# Patient Record
Sex: Female | Born: 1970 | Race: Black or African American | Hispanic: No | Marital: Married | Smoking: Never smoker
Health system: Southern US, Community
[De-identification: ages and names within clinical notes are randomized; demographics above are authoritative.]

---

## 2005-06-16 DIAGNOSIS — H6123 Impacted cerumen, bilateral: Secondary | ICD-10-CM | POA: Insufficient documentation

## 2012-02-20 DIAGNOSIS — O2690 Pregnancy related conditions, unspecified, unspecified trimester: Secondary | ICD-10-CM | POA: Insufficient documentation

## 2012-02-20 DIAGNOSIS — O009 Unspecified ectopic pregnancy without intrauterine pregnancy: Secondary | ICD-10-CM | POA: Insufficient documentation

## 2013-06-14 DIAGNOSIS — M7740 Metatarsalgia, unspecified foot: Secondary | ICD-10-CM | POA: Insufficient documentation

## 2014-10-25 DIAGNOSIS — J019 Acute sinusitis, unspecified: Secondary | ICD-10-CM | POA: Insufficient documentation

## 2015-11-17 ENCOUNTER — Encounter: Payer: Self-pay | Admitting: Emergency Medicine

## 2015-11-17 ENCOUNTER — Emergency Department
Admission: EM | Admit: 2015-11-17 | Discharge: 2015-11-18 | Disposition: A | Discharge status: Home / Self Care | Payer: Managed Care, Other (non HMO) | Attending: Emergency Medicine | Admitting: Emergency Medicine

## 2015-11-17 DIAGNOSIS — N83209 Unspecified ovarian cyst, unspecified side: Secondary | ICD-10-CM

## 2015-11-17 DIAGNOSIS — N83202 Unspecified ovarian cyst, left side: Secondary | ICD-10-CM | POA: Insufficient documentation

## 2015-11-17 DIAGNOSIS — Z3202 Encounter for pregnancy test, result negative: Secondary | ICD-10-CM | POA: Insufficient documentation

## 2015-11-17 DIAGNOSIS — R109 Unspecified abdominal pain: Secondary | ICD-10-CM | POA: Diagnosis present

## 2015-11-17 LAB — CBC WITH DIFFERENTIAL/PLATELET
BASOS ABS: 0.1 10*3/uL (ref 0–0.1)
BASOS PCT: 1 %
EOS ABS: 0.2 10*3/uL (ref 0–0.7)
EOS PCT: 3 %
HCT: 32.3 % — ABNORMAL LOW (ref 35.0–47.0)
Hemoglobin: 10 g/dL — ABNORMAL LOW (ref 12.0–16.0)
LYMPHS PCT: 24 %
Lymphs Abs: 1.7 10*3/uL (ref 1.0–3.6)
MCH: 22.1 pg — ABNORMAL LOW (ref 26.0–34.0)
MCHC: 30.8 g/dL — ABNORMAL LOW (ref 32.0–36.0)
MCV: 71.5 fL — AB (ref 80.0–100.0)
Monocytes Absolute: 0.7 10*3/uL (ref 0.2–0.9)
Monocytes Relative: 10 %
Neutro Abs: 4.4 10*3/uL (ref 1.4–6.5)
Neutrophils Relative %: 62 %
PLATELETS: 250 10*3/uL (ref 150–440)
RBC: 4.52 MIL/uL (ref 3.80–5.20)
RDW: 14.5 % (ref 11.5–14.5)
WBC: 7 10*3/uL (ref 3.6–11.0)

## 2015-11-17 MED ORDER — SODIUM CHLORIDE 0.9 % IV BOLUS (SEPSIS)
1000.0000 mL | Freq: Once | INTRAVENOUS | Status: AC
  Administered 2015-11-17: 1000 mL via INTRAVENOUS

## 2015-11-17 MED ORDER — ALUM & MAG HYDROXIDE-SIMETH 200-200-20 MG/5ML PO SUSP
30.0000 mL | Freq: Once | ORAL | Status: AC
  Administered 2015-11-18: 30 mL via ORAL
  Filled 2015-11-17: qty 30

## 2015-11-17 MED ORDER — ACETAMINOPHEN 500 MG PO TABS
1000.0000 mg | ORAL_TABLET | ORAL | Status: AC
  Administered 2015-11-18: 1000 mg via ORAL
  Filled 2015-11-17: qty 2

## 2015-11-17 MED ORDER — ONDANSETRON 4 MG PO TBDP
4.0000 mg | ORAL_TABLET | Freq: Once | ORAL | Status: AC
  Administered 2015-11-18: 4 mg via ORAL
  Filled 2015-11-17: qty 1

## 2015-11-17 NOTE — ED Notes (Signed)
abd pain started a little after 3 pm yesterday.  Thought it was gas at first.  Relieved with passing gas and bowel movement.  Actually went away during the night and progressively went back to the level it was yesterday as the day went on.  Also ate a burger from [employer] and there was a metal wire like a bristle from a scrub used to clean grill in it, she spat that out.

## 2015-11-17 NOTE — ED Notes (Signed)
Pt c/o of upper abdominal pain currently rated at a 7 out of 10 described as sharp/stabbing/throbbing beginning yesterday.  Pt had bowel movement at approx 10:30am, and had a decrease in pain down to a 2 out of 10. Pt denies SOB, chest pain, N/V.  Pt still currently passing flatus.

## 2015-11-18 ENCOUNTER — Emergency Department: Payer: Managed Care, Other (non HMO)

## 2015-11-18 DIAGNOSIS — N83202 Unspecified ovarian cyst, left side: Secondary | ICD-10-CM | POA: Diagnosis not present

## 2015-11-18 LAB — URINALYSIS COMPLETE WITH MICROSCOPIC (ARMC ONLY)
BACTERIA UA: NONE SEEN
Bilirubin Urine: NEGATIVE
Glucose, UA: NEGATIVE mg/dL
HGB URINE DIPSTICK: NEGATIVE
Ketones, ur: NEGATIVE mg/dL
LEUKOCYTES UA: NEGATIVE
Nitrite: NEGATIVE
PH: 8 (ref 5.0–8.0)
PROTEIN: NEGATIVE mg/dL
Specific Gravity, Urine: 1.017 (ref 1.005–1.030)

## 2015-11-18 LAB — COMPREHENSIVE METABOLIC PANEL
ALBUMIN: 3.9 g/dL (ref 3.5–5.0)
ALT: 24 U/L (ref 14–54)
ANION GAP: 4 — AB (ref 5–15)
AST: 22 U/L (ref 15–41)
Alkaline Phosphatase: 66 U/L (ref 38–126)
BUN: 13 mg/dL (ref 6–20)
CO2: 26 mmol/L (ref 22–32)
Calcium: 8.8 mg/dL — ABNORMAL LOW (ref 8.9–10.3)
Chloride: 107 mmol/L (ref 101–111)
Creatinine, Ser: 0.88 mg/dL (ref 0.44–1.00)
GFR calc Af Amer: >60 mL/min (ref >60)
GFR calc non Af Amer: >60 mL/min (ref >60)
GLUCOSE: 116 mg/dL — AB (ref 65–99)
POTASSIUM: 3.8 mmol/L (ref 3.5–5.1)
SODIUM: 137 mmol/L (ref 135–145)
Total Bilirubin: 0.3 mg/dL (ref 0.3–1.2)
Total Protein: 7 g/dL (ref 6.5–8.1)

## 2015-11-18 LAB — HEMOGLOBIN AND HEMATOCRIT, BLOOD
HCT: 31.4 % — ABNORMAL LOW (ref 35.0–47.0)
HEMOGLOBIN: 9.7 g/dL — AB (ref 12.0–16.0)

## 2015-11-18 LAB — LIPASE, BLOOD: LIPASE: 32 U/L (ref 11–51)

## 2015-11-18 LAB — PREGNANCY, URINE: PREG TEST UR: NEGATIVE

## 2015-11-18 MED ORDER — IOHEXOL 240 MG/ML SOLN
25.0000 mL | Freq: Once | INTRAMUSCULAR | Status: AC | PRN
  Administered 2015-11-18: 25 mL via ORAL

## 2015-11-18 MED ORDER — ONDANSETRON 4 MG PO TBDP: 4.0000 mg | ORAL_TABLET | Freq: Four times a day (QID) | ORAL | Status: AC | PRN

## 2015-11-18 MED ORDER — HYDROCODONE-ACETAMINOPHEN 5-325 MG PO TABS: 1.0000 | ORAL_TABLET | Freq: Four times a day (QID) | ORAL | Status: DC | PRN

## 2015-11-18 MED ORDER — IOHEXOL 300 MG/ML  SOLN
100.0000 mL | Freq: Once | INTRAMUSCULAR | Status: AC | PRN
  Administered 2015-11-18: 100 mL via INTRAVENOUS

## 2015-11-18 NOTE — ED Notes (Signed)
Reviewed d/c instructions, prescriptions, pain management, and reasons to return for further care/treatment to MD and/or ED. Pt verbalized understanding.

## 2015-11-18 NOTE — ED Notes (Signed)
Pt. POCT RESULTS= NEGATIVE

## 2015-11-18 NOTE — ED Provider Notes (Signed)
[facility] Emergency Department Provider Note REMINDER - THIS NOTE IS NOT A FINAL MEDICAL RECORD UNTIL IT IS SIGNED. UNTIL THEN, THE CONTENT BELOW MAY REFLECT INFORMATION FROM A DOCUMENTATION TEMPLATE, NOT THE ACTUAL PATIENT VISIT. ____________________________________________  Time seen: Approximately 11:56 AM  I have reviewed the triage vital signs and the nursing notes.   HISTORY  Chief Complaint Abdominal Pain    HPI Tammy James is a 45 y.o. female presents for evaluation of 2 fairly severe episodes of abdominal pain. She reports that starting yesterday after eating [employer], she developed fairly severe crampy gas pains in the abdomen. Located across the mid abdomen. No associated any fevers chills chest pain or trouble breathing. He reports that this lasted for a few hours and "doubled her over" and pain. This went away, except for a slight feeling of discomfort after having a bowel movement. She also reports that she ate a burger at [employer] and there was a wire and it but she spit this out. No injury.  This evening she had another episode of fairly severe abdominal pain up into her over located the mid abdomen. It is now improved, but she still has a mild ache gassy feeling. Denies any pelvic pain, vaginal bleeding, pregnancy, or other new concerns. No chest pain or trouble breathing. No vomiting.  History reviewed. No pertinent past medical history.  There are no active problems to display for this patient.   History reviewed. No pertinent past surgical history.  Current Outpatient Rx  Name  Route  Sig  Dispense  Refill  . HYDROcodone-acetaminophen (NORCO/VICODIN) 5-325 MG tablet   Oral   Take 1 tablet by mouth every 6 (six) hours as needed for moderate pain.   20 tablet   0   . ondansetron (ZOFRAN ODT) 4 MG disintegrating tablet   Oral   Take 1 tablet (4 mg total) by mouth every 6 (six) hours as needed for nausea or vomiting.   20 tablet    0     Allergies Sulfa antibiotics and Alcohol-sulfur  No family history on file.  Social History Social History  Substance Use Topics  . Smoking status: Never Smoker   . Smokeless tobacco: None  . Alcohol Use: No    Review of Systems Constitutional: No fever/chills Eyes: No visual changes. ENT: No sore throat. Cardiovascular: Denies chest pain. Respiratory: Denies shortness of breath. Gastrointestinal: No nausea, no vomiting.  No diarrhea.  No constipation. Genitourinary: Negative for dysuria. Musculoskeletal: Negative for back pain. Skin: Negative for rash. Neurological: Negative for headaches, focal weakness or numbness.  10-point ROS otherwise negative.  ____________________________________________   PHYSICAL EXAM:  VITAL SIGNS: ED Triage Vitals  Enc Vitals Group     BP 11/17/15 2139 130/92 mmHg     Pulse Rate 11/17/15 2139 107     Resp 11/17/15 2139 18     Temp 11/17/15 2139 98.1 F (36.7 C)     Temp Source 11/17/15 2139 Oral     SpO2 11/17/15 2139 98 %     Weight 11/17/15 2139 191 lb (86.637 kg)     Height 11/17/15 2139 5\' 3"  (1.6 m)     Head Cir --      Peak Flow --      Pain Score 11/17/15 2141 7     Pain Loc --      Pain Edu? --      Excl. in GC? --    Constitutional: Alert and oriented. Well appearing  and in no acute distress. Amicable. Patient and her husband seated comfortably in room. Eyes: Conjunctivae are normal. PERRL. EOMI. Head: Atraumatic. Nose: No congestion/rhinnorhea. Mouth/Throat: Mucous membranes are moist.  Oropharynx non-erythematous. Neck: No stridor.   Cardiovascular: Normal rate, regular rhythm. Grossly normal heart sounds.  Good peripheral circulation. Respiratory: Normal respiratory effort.  No retractions. Lungs CTAB. Gastrointestinal: Soft and nondistended. Patient has moderate tenderness without rebound or guarding. The likely, also some moderate tenderness over McBurney's point and right flank. No distention. No  abdominal bruits. No CVA tenderness. No left-sided tenderness except for some mild suprapubic tenderness and also moderate discomfort over the left lower abdomen without rebound or guarding. Musculoskeletal: No lower extremity tenderness nor edema.  No joint effusions. Neurologic:  Normal speech and language. No gross focal neurologic deficits are appreciated. Skin:  Skin is warm, dry and intact. No rash noted. Psychiatric: Mood and affect are normal. Speech and behavior are normal.  ____________________________________________   LABS (all labs ordered are listed, but only abnormal results are displayed)  Labs Reviewed  COMPREHENSIVE METABOLIC PANEL - Abnormal; Notable for the following:    Glucose, Bld 116 (*)    Calcium 8.8 (*)    Anion gap 4 (*)    All other components within normal limits  CBC WITH DIFFERENTIAL/PLATELET - Abnormal; Notable for the following:    Hemoglobin 10.0 (*)    HCT 32.3 (*)    MCV 71.5 (*)    MCH 22.1 (*)    MCHC 30.8 (*)    All other components within normal limits  URINALYSIS COMPLETEWITH MICROSCOPIC ([facility] ONLY) - Abnormal; Notable for the following:    Color, Urine YELLOW (*)    APPearance CLEAR (*)    Squamous Epithelial / LPF 0-5 (*)    All other components within normal limits  HEMOGLOBIN AND HEMATOCRIT, BLOOD - Abnormal; Notable for the following:    Hemoglobin 9.7 (*)    HCT 31.4 (*)    All other components within normal limits  LIPASE, BLOOD  PREGNANCY, URINE  POC URINE PREG, ED   ____________________________________________  EKG   ____________________________________________  RADIOLOGY  US Transvaginal Non-OB (Final result) Result time: 11/18/15 04:59:47   Final result by Rad Results In Interface (11/18/15 04:59:47)   Narrative:   CLINICAL DATA: Acute onset of periumbilical abdominal pain. Left adnexal cyst noted on CT. Evaluate for ovarian torsion. Initial encounter.  EXAM: TRANSABDOMINAL AND TRANSVAGINAL ULTRASOUND OF  PELVIS  DOPPLER ULTRASOUND OF OVARIES  TECHNIQUE: Both transabdominal and transvaginal ultrasound examinations of the pelvis were performed. Transabdominal technique was performed for global imaging of the pelvis including uterus, ovaries, adnexal regions, and pelvic cul-de-sac.  It was necessary to proceed with endovaginal exam following the transabdominal exam to visualize the uterus and ovaries in greater detail. Color and duplex Doppler ultrasound was utilized to evaluate blood flow to the ovaries.  COMPARISON: CT of the abdomen and pelvis performed earlier today at 1:57 a.m.  FINDINGS: Uterus  Measurements: 11.6 x 4.8 x 6.3 cm. No fibroids or other mass visualized.  Endometrium  Thickness: 1.4 cm. No focal abnormality visualized.  Right ovary  Measurements: 3.8 x 1.8 x 1.5 cm. Normal appearance/no adnexal mass.  Left ovary  Measurements: 4.6 x 2.9 x 3.0 cm. There is a mildly complex left ovarian cyst measuring 3.2 x 2.3 x 2.2 cm, with internal septation and scattered echoes, likely reflecting a hemorrhagic cyst.  Pulsed Doppler evaluation of both ovaries demonstrates normal low-resistance arterial and venous waveforms.  Other  findings  A small to moderate amount of blood is noted tracking within the pelvic cul-de-sac. With ultrasound correlation, this is also characterized on recent CT.  IMPRESSION: Left-sided hemorrhagic cyst noted, measuring 3.2 cm. Small to moderate amount of blood tracking within the pelvic cul-de-sac, likely reflecting rupture of the hemorrhagic cyst. No evidence for ovarian torsion. Uterus unremarkable in appearance.  These results were called by telephone at the time of interpretation on 11/18/2015 at 4:58 am to Dr. Sharyn CreamerMARK Maud Rubendall, who verbally acknowledged these results.   Electronically Signed By: Roanna RaiderJeffery Chang M.D. On: 11/18/2015 04:59          US Art/Ven Flow Abd Pelv Doppler (Final result) Result time: 11/18/15  04:59:47   Final result by Rad Results In Interface (11/18/15 04:59:47)   Narrative:   CLINICAL DATA: Acute onset of periumbilical abdominal pain. Left adnexal cyst noted on CT. Evaluate for ovarian torsion. Initial encounter.  EXAM: TRANSABDOMINAL AND TRANSVAGINAL ULTRASOUND OF PELVIS  DOPPLER ULTRASOUND OF OVARIES  TECHNIQUE: Both transabdominal and transvaginal ultrasound examinations of the pelvis were performed. Transabdominal technique was performed for global imaging of the pelvis including uterus, ovaries, adnexal regions, and pelvic cul-de-sac.  It was necessary to proceed with endovaginal exam following the transabdominal exam to visualize the uterus and ovaries in greater detail. Color and duplex Doppler ultrasound was utilized to evaluate blood flow to the ovaries.  COMPARISON: CT of the abdomen and pelvis performed earlier today at 1:57 a.m.  FINDINGS: Uterus  Measurements: 11.6 x 4.8 x 6.3 cm. No fibroids or other mass visualized.  Endometrium  Thickness: 1.4 cm. No focal abnormality visualized.  Right ovary  Measurements: 3.8 x 1.8 x 1.5 cm. Normal appearance/no adnexal mass.  Left ovary  Measurements: 4.6 x 2.9 x 3.0 cm. There is a mildly complex left ovarian cyst measuring 3.2 x 2.3 x 2.2 cm, with internal septation and scattered echoes, likely reflecting a hemorrhagic cyst.  Pulsed Doppler evaluation of both ovaries demonstrates normal low-resistance arterial and venous waveforms.  Other findings  A small to moderate amount of blood is noted tracking within the pelvic cul-de-sac. With ultrasound correlation, this is also characterized on recent CT.  IMPRESSION: Left-sided hemorrhagic cyst noted, measuring 3.2 cm. Small to moderate amount of blood tracking within the pelvic cul-de-sac, likely reflecting rupture of the hemorrhagic cyst. No evidence for ovarian torsion. Uterus unremarkable in appearance.  These results were called by  telephone at the time of interpretation on 11/18/2015 at 4:58 am to Dr. Sharyn CreamerMARK Iyah Laguna, who verbally acknowledged these results.   Electronically Signed By: Roanna RaiderJeffery Chang M.D. On: 11/18/2015 04:59          US Pelvis Complete (Final result) Result time: 11/18/15 04:59:47   Final result by Rad Results In Interface (11/18/15 04:59:47)   Narrative:   CLINICAL DATA: Acute onset of periumbilical abdominal pain. Left adnexal cyst noted on CT. Evaluate for ovarian torsion. Initial encounter.  EXAM: TRANSABDOMINAL AND TRANSVAGINAL ULTRASOUND OF PELVIS  DOPPLER ULTRASOUND OF OVARIES  TECHNIQUE: Both transabdominal and transvaginal ultrasound examinations of the pelvis were performed. Transabdominal technique was performed for global imaging of the pelvis including uterus, ovaries, adnexal regions, and pelvic cul-de-sac.  It was necessary to proceed with endovaginal exam following the transabdominal exam to visualize the uterus and ovaries in greater detail. Color and duplex Doppler ultrasound was utilized to evaluate blood flow to the ovaries.  COMPARISON: CT of the abdomen and pelvis performed earlier today at 1:57 a.m.  FINDINGS: Uterus  Measurements: 11.6  x 4.8 x 6.3 cm. No fibroids or other mass visualized.  Endometrium  Thickness: 1.4 cm. No focal abnormality visualized.  Right ovary  Measurements: 3.8 x 1.8 x 1.5 cm. Normal appearance/no adnexal mass.  Left ovary  Measurements: 4.6 x 2.9 x 3.0 cm. There is a mildly complex left ovarian cyst measuring 3.2 x 2.3 x 2.2 cm, with internal septation and scattered echoes, likely reflecting a hemorrhagic cyst.  Pulsed Doppler evaluation of both ovaries demonstrates normal low-resistance arterial and venous waveforms.  Other findings  A small to moderate amount of blood is noted tracking within the pelvic cul-de-sac. With ultrasound correlation, this is also characterized on recent  CT.  IMPRESSION: Left-sided hemorrhagic cyst noted, measuring 3.2 cm. Small to moderate amount of blood tracking within the pelvic cul-de-sac, likely reflecting rupture of the hemorrhagic cyst. No evidence for ovarian torsion. Uterus unremarkable in appearance.  These results were called by telephone at the time of interpretation on 11/18/2015 at 4:58 am to Dr. Sharyn Creamer, who verbally acknowledged these results.   Electronically Signed By: Roanna Raider M.D. On: 11/18/2015 04:59          CT Abdomen Pelvis W Contrast (Final result) Result time: 11/18/15 02:45:27   Final result by Rad Results In Interface (11/18/15 02:45:27)   Narrative:   CLINICAL DATA: Acute onset of generalized abdominal pain. Initial encounter.  EXAM: CT ABDOMEN AND PELVIS WITH CONTRAST  TECHNIQUE: Multidetector CT imaging of the abdomen and pelvis was performed using the standard protocol following bolus administration of intravenous contrast.  CONTRAST: OMNIPAQUE IOHEXOL 300 MG/ML SOLN  COMPARISON: None.  FINDINGS: The visualized lung bases are clear. Contrast within the distal esophagus may reflect gastroesophageal reflux or esophageal dysmotility.  The liver and spleen are unremarkable in appearance. The gallbladder is within normal limits. The pancreas and adrenal glands are unremarkable.  A 4 mm nonobstructing stone is noted near the upper pole of the right kidney. The kidneys are otherwise unremarkable. There is no evidence of hydronephrosis. No obstructing ureteral stones are seen. No significant perinephric stranding is appreciated.  No free fluid is identified. The small bowel is unremarkable in appearance. The stomach is within normal limits. No acute vascular abnormalities are seen.  The appendix is normal in caliber, without evidence of appendicitis. The colon is unremarkable in appearance.  The bladder is mildly distended and grossly unremarkable. The  uterus is grossly unremarkable in appearance. Bilateral adnexal cystic foci, measuring up to 3.2 cm on the left, are likely physiologic. No inguinal lymphadenopathy is seen.  No acute osseous abnormalities are identified.  IMPRESSION: 1. No acute abnormality seen within the abdomen or pelvis. 2. Contrast within the distal esophagus may reflect gastroesophageal reflux or esophageal dysmotility. 3. 4 mm nonobstructing stone near the upper pole of the right kidney. 4. Bilateral adnexal cystic foci, measuring up to 3.2 cm on the left, are likely physiologic, though depending on the degree of clinical concern, pelvic ultrasound could be considered for further evaluation on an elective nonemergent basis.   Electronically Signed By: Roanna Raider M.D. On: 11/18/2015 02:45          ____________________________________________   PROCEDURES  Procedure(s) performed: None  Critical Care performed: No  ____________________________________________   INITIAL IMPRESSION / ASSESSMENT AND PLAN / ED COURSE  Pertinent labs & imaging results that were available during my care of the patient were reviewed by me and considered in my medical decision making (see chart for details).  Transfer evaluation of mid abdominal  pain. Currently over the approximately last 24 hours without associated fevers chills nausea or emesis. Overall reassuring exam she does have moderate tenderness suprapubically and over the lower abdomen bilaterally.  ----------------------------------------- 1:18 AM on 11/18/2015 -----------------------------------------  On repeat exam, the patient continues to have mild to moderate tenderness periumbilically and also some over McBurney's point. Discussed risks and benefits of CT scan, and watchful waiting and follow up with good return precautions versus CT scan to further evaluate for causes of pain. Patient requests CT scan having discussed risks and benefits  which I think is reasonable.  ----------------------------------------- 5:20 AM on 11/18/2015 -----------------------------------------  Awake alert. She reports not having any pain or discomfort at this time. Ultrasound reveals hemorrhagic cyst, this appears to explain her symptoms. She is hemodynamically stable and does tell me she is been told she has anemia previously. We have no baseline hemoglobin, thus I will send a repeat hemoglobin and hematocrit now. If stable, her vital signs are normal we'll plan to discharge her with careful return precautions. She follows up with [facility], and will follow-up closely with them. Careful return precautions advised. Patient has been driving her home.  I will prescribe the patient a narcotic pain medicine due to their condition which I anticipate will cause at least moderate pain short term. I discussed with the patient safe use of narcotic pain medicines, and that they are not to drive, work in dangerous areas, or ever take more than prescribed (no more than 1 pill every 6 hours). We discussed that this is the type of medication that "Criss Alvine" may have overdosed on and the risks of this type of medicine. Patient is very agreeable to only use as prescribed and to never use more than prescribed.  ____________________________________________   FINAL CLINICAL IMPRESSION(S) / ED DIAGNOSES  Final diagnoses:  Hemorrhagic cyst of ovary      Sharyn Creamer, MD 11/18/15 [phone]

## 2015-11-18 NOTE — ED Notes (Signed)
Patient transported to CT 

## 2015-11-18 NOTE — Discharge Instructions (Signed)
Please follow up closely with your obstetrician. Return to the emergency room if you develop severe bleeding, severe increase in pain, a swollen abdomen, vomiting, pain is out of control, you feel weak or lightheaded, or other new concerns arise.

## 2017-01-05 IMAGING — CT CT ABD-PELV W/ CM
1 of 3 series · 14 of 32 positions shown, 19 images · IV contrast (omnipaque)
Comparison: None.

CLINICAL DATA: Acute onset of generalized abdominal pain. Initial
encounter.

EXAM:
CT ABDOMEN AND PELVIS WITH CONTRAST
TECHNIQUE: Multidetector CT imaging of the abdomen and pelvis was performed
using the standard protocol following bolus administration of
intravenous contrast.
CONTRAST:  100mL OMNIPAQUE IOHEXOL 300 MG/ML  SOLN

[Series 2: routine abd pel with · axial · 0.69mm/px · z∈[-421,-46]mm · 14 of 85 slices shown, 19 images]
[im 5/85  soft-tissue]
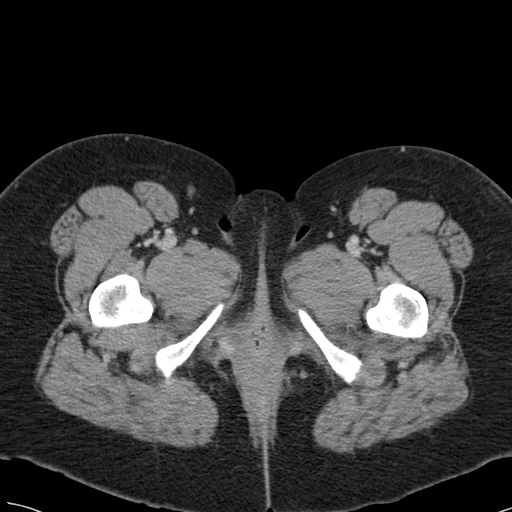
[im 5/85  bone]
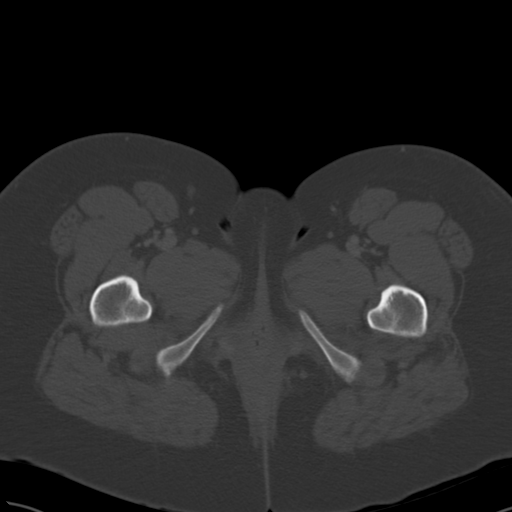
[im 13/85  soft-tissue]
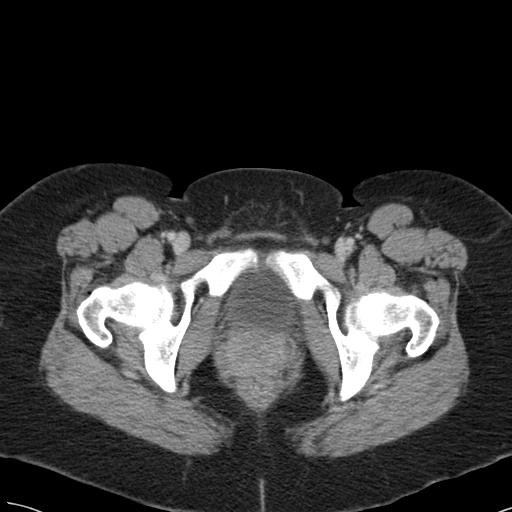
[im 17/85  soft-tissue]
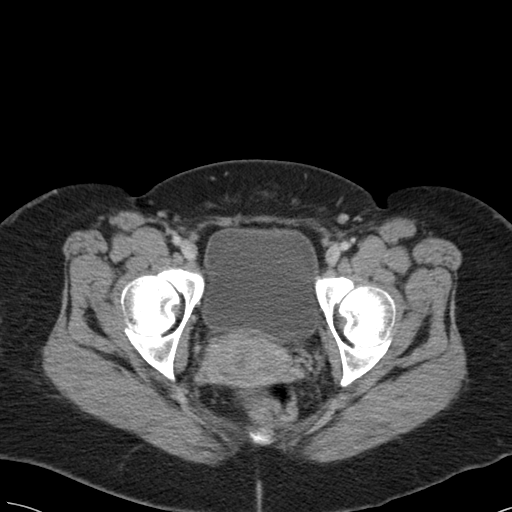
[im 26/85  soft-tissue]
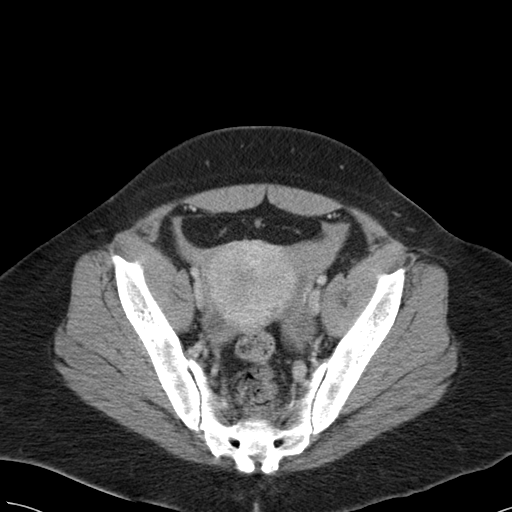
[im 30/85  soft-tissue]
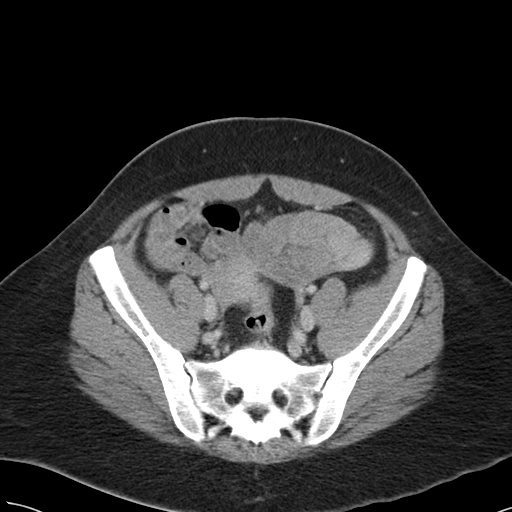
[im 38/85  soft-tissue]
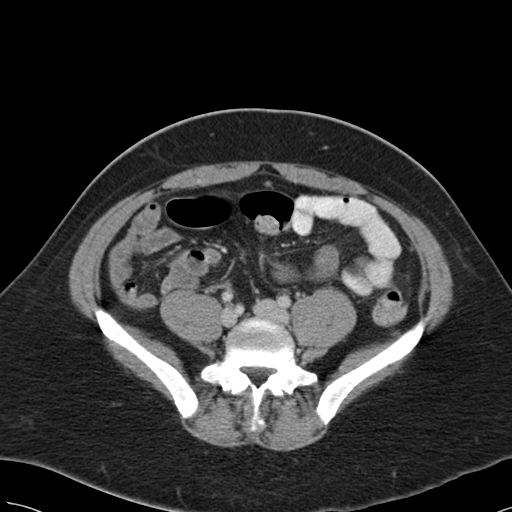
[im 43/85  soft-tissue]
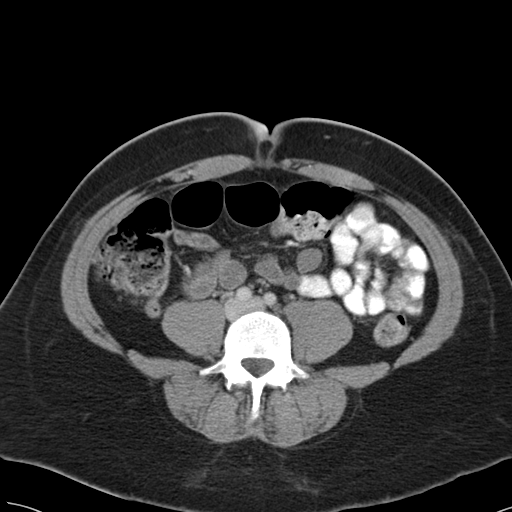
[im 47/85  soft-tissue]
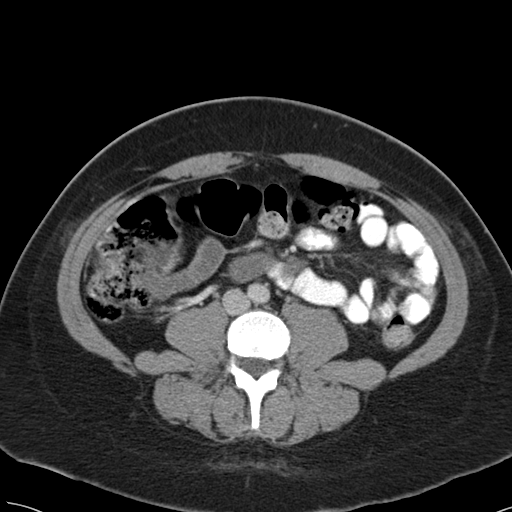
[im 55/85  soft-tissue]
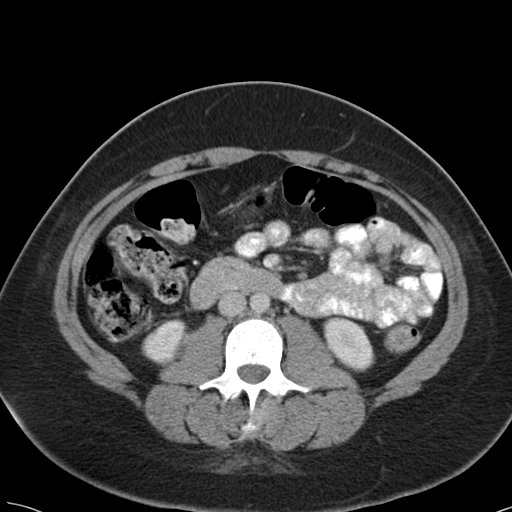
[im 55/85  bone]
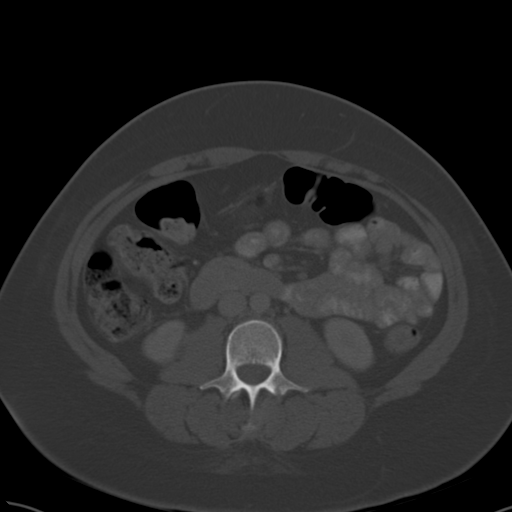
[im 59/85  soft-tissue]
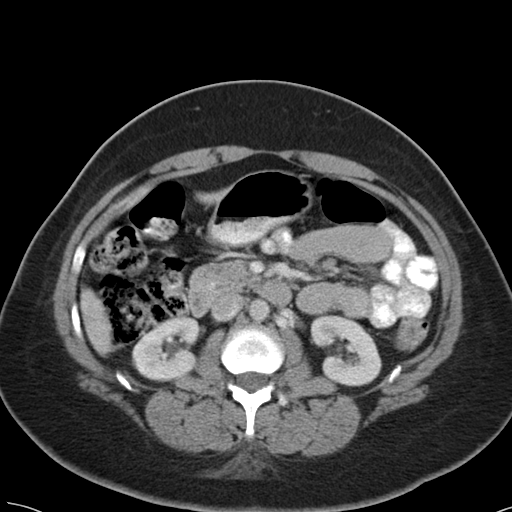
[im 68/85  soft-tissue]
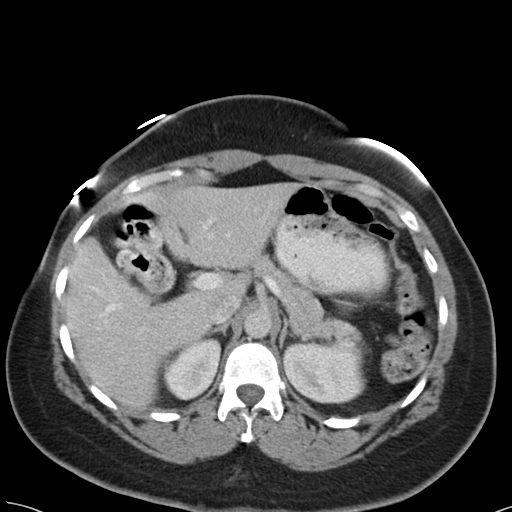
[im 68/85  lung]
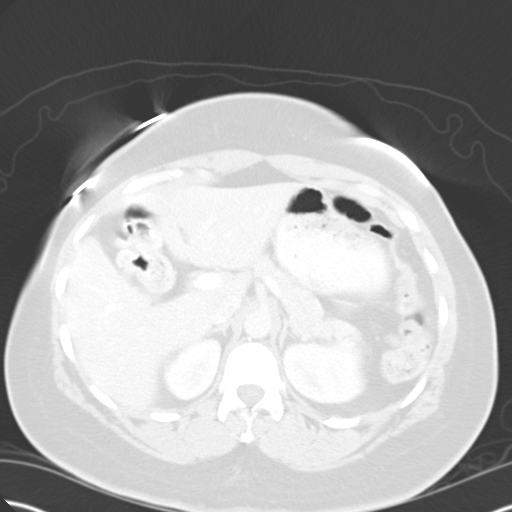
[im 72/85  soft-tissue]
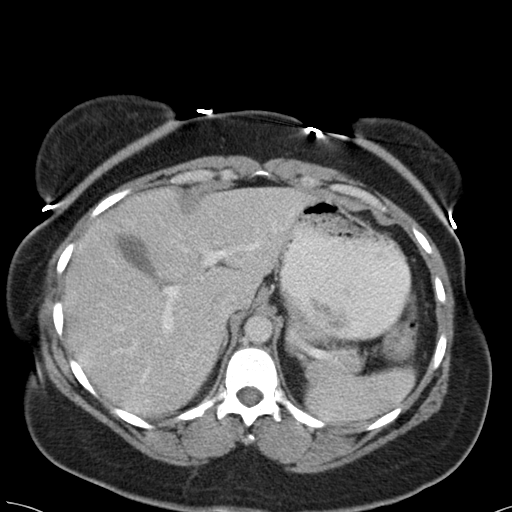
[im 72/85  lung]
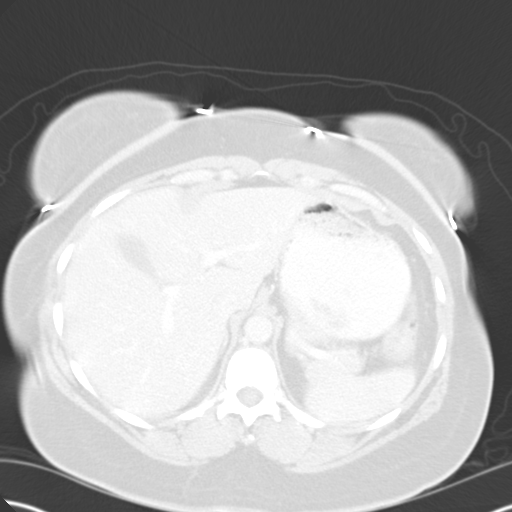
[im 76/85  lung]
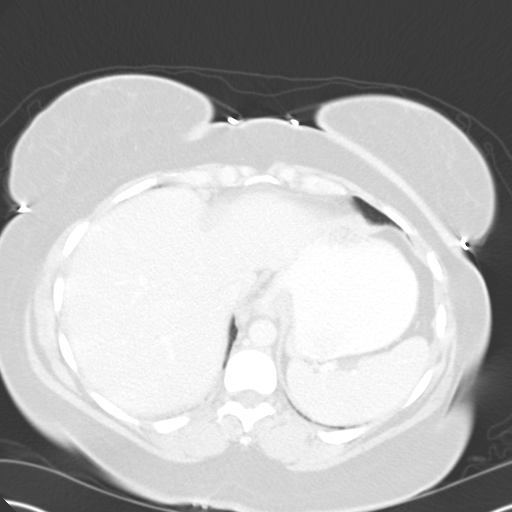
[im 80/85  soft-tissue]
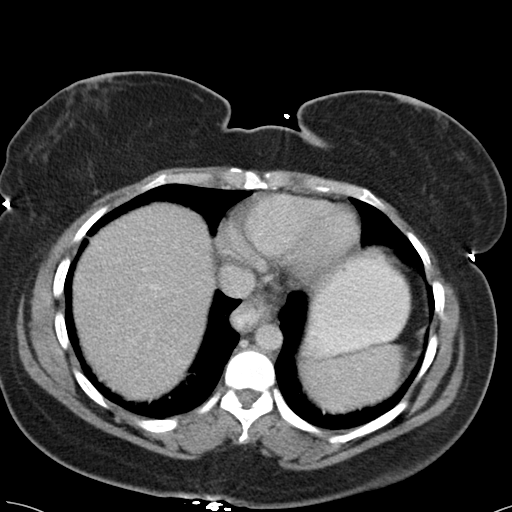
[im 80/85  lung]
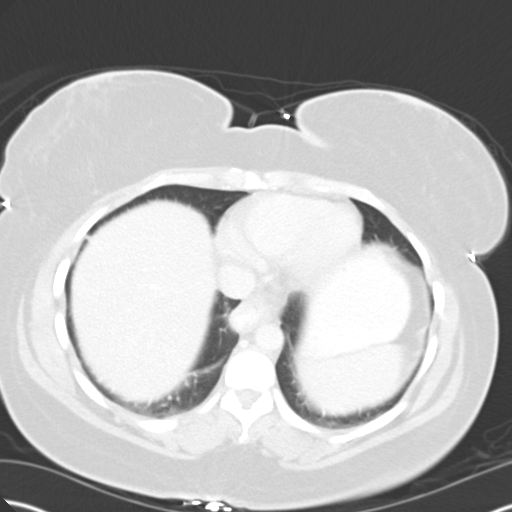

[14 of 32 positions shown; findings below may reference images not displayed]

FINDINGS: The visualized lung bases are clear. Contrast within the distal
esophagus may reflect gastroesophageal reflux or esophageal
dysmotility.

The liver and spleen are unremarkable in appearance. The gallbladder
is within normal limits. The pancreas and adrenal glands are
unremarkable.

A 4 mm nonobstructing stone is noted near the upper pole of the
right kidney. The kidneys are otherwise unremarkable. There is no
evidence of hydronephrosis. No obstructing ureteral stones are seen.
No significant perinephric stranding is appreciated.

No free fluid is identified. The small bowel is unremarkable in
appearance. The stomach is within normal limits. No acute vascular
abnormalities are seen.

The appendix is normal in caliber, without evidence of appendicitis.
The colon is unremarkable in appearance.

The bladder is mildly distended and grossly unremarkable. The uterus
is grossly unremarkable in appearance. Bilateral adnexal cystic
foci, measuring up to 3.2 cm on the left, are likely physiologic. No
inguinal lymphadenopathy is seen.

No acute osseous abnormalities are identified.
IMPRESSION: 1. No acute abnormality seen within the abdomen or pelvis.
2. Contrast within the distal esophagus may reflect gastroesophageal
reflux or esophageal dysmotility.
3. 4 mm nonobstructing stone near the upper pole of the right
kidney.
4. Bilateral adnexal cystic foci, measuring up to 3.2 cm on the
left, are likely physiologic, though depending on the degree of
clinical concern, pelvic ultrasound could be considered for further
evaluation on an elective nonemergent basis.

## 2017-03-18 IMAGING — US US TRANSVAGINAL NON-OB
1 series · 13 of 25 positions shown · non-contrast
Comparison: CT of the abdomen and pelvis performed earlier today at
[DATE] a.m.

CLINICAL DATA: Acute onset of periumbilical abdominal pain. Left
adnexal cyst noted on CT. Evaluate for ovarian torsion. Initial
encounter.

EXAM:
TRANSABDOMINAL AND TRANSVAGINAL ULTRASOUND OF PELVIS
DOPPLER ULTRASOUND OF OVARIES
TECHNIQUE: Both transabdominal and transvaginal ultrasound examinations of the
pelvis were performed. Transabdominal technique was performed for
global imaging of the pelvis including uterus, ovaries, adnexal
regions, and pelvic cul-de-sac.
It was necessary to proceed with endovaginal exam following the
transabdominal exam to visualize the uterus and ovaries in greater
detail. Color and duplex Doppler ultrasound was utilized to evaluate
blood flow to the ovaries.

[Series 1: us transvaginal non-ob · 0.25mm/px · 13 of 103 slices shown]
[im 1/103]
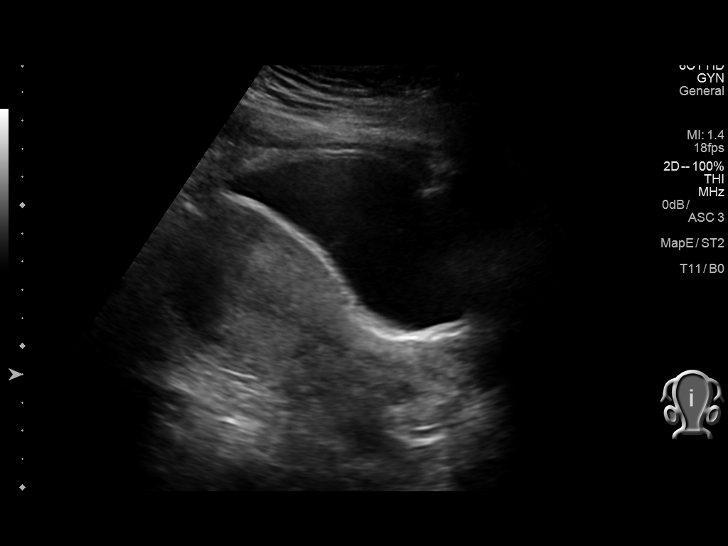
[im 9/103]
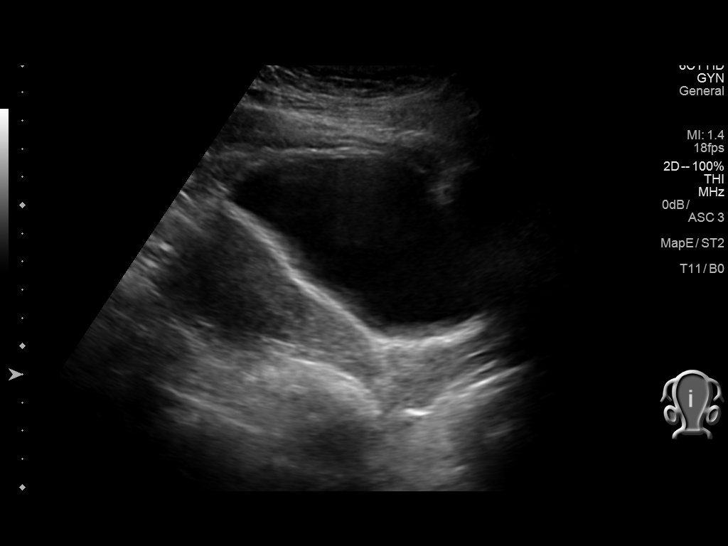
[im 18/103]
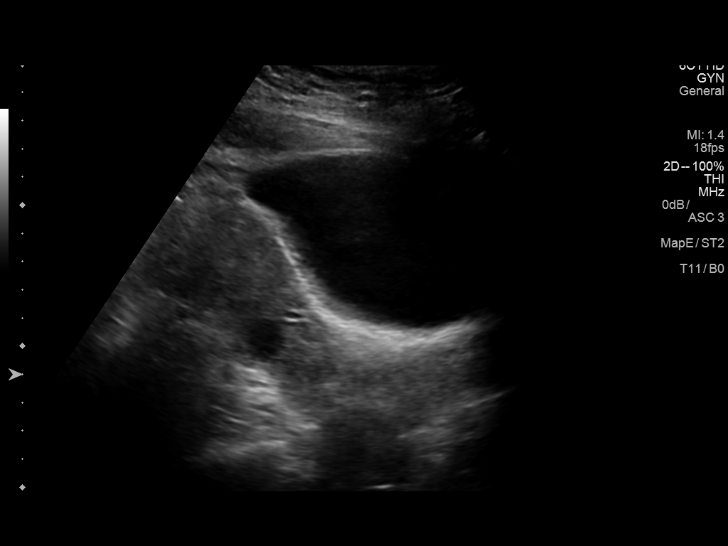
[im 26/103]
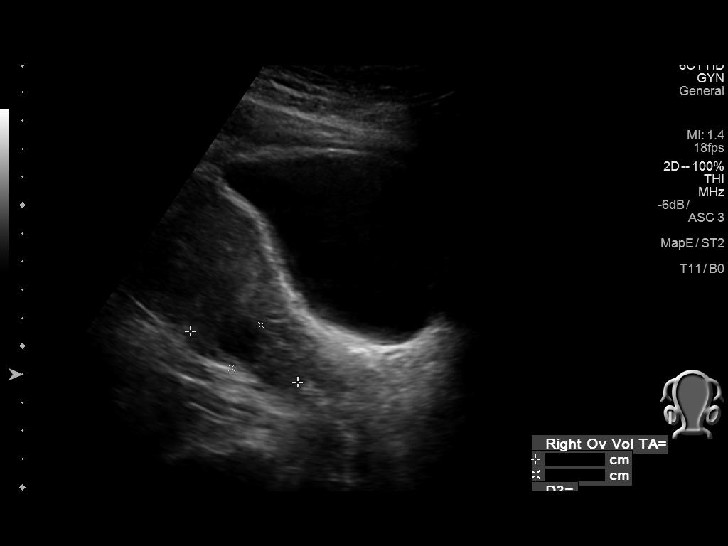
[im 35/103]
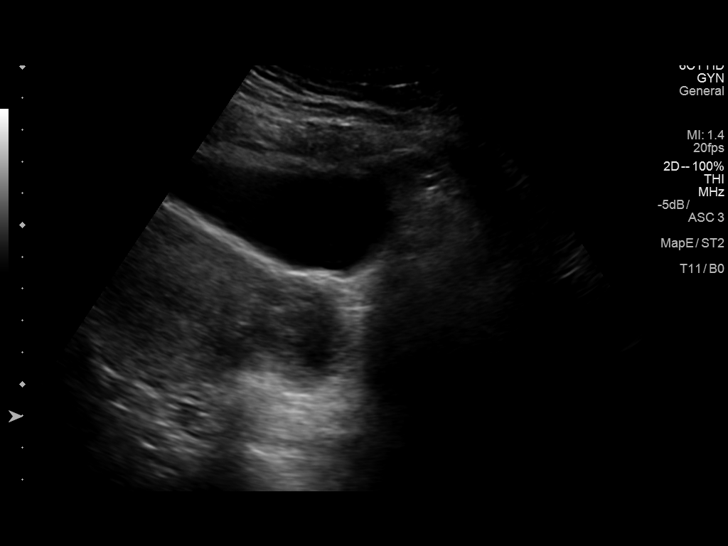
[im 43/103]
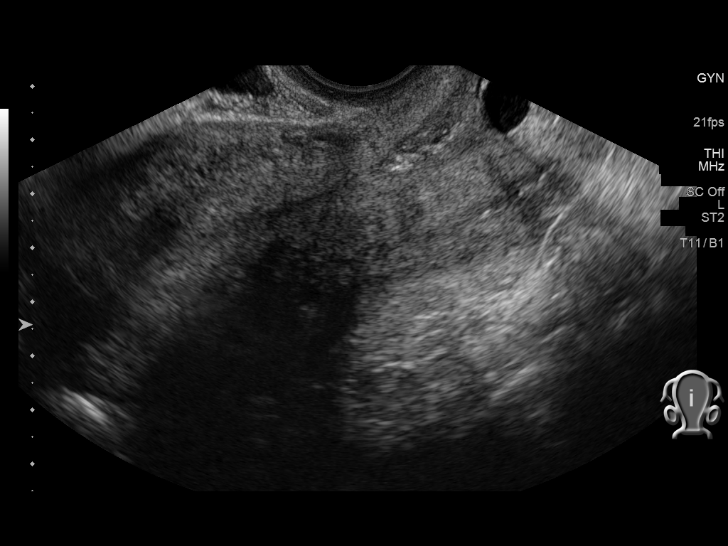
[im 52/103]
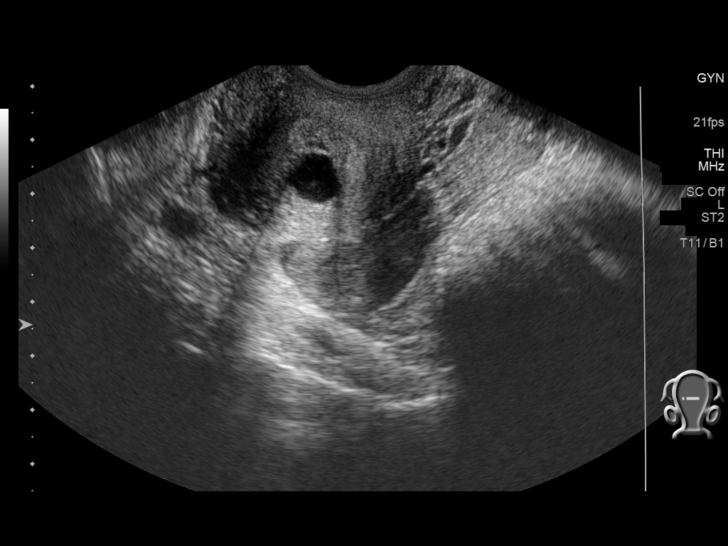
[im 60/103]
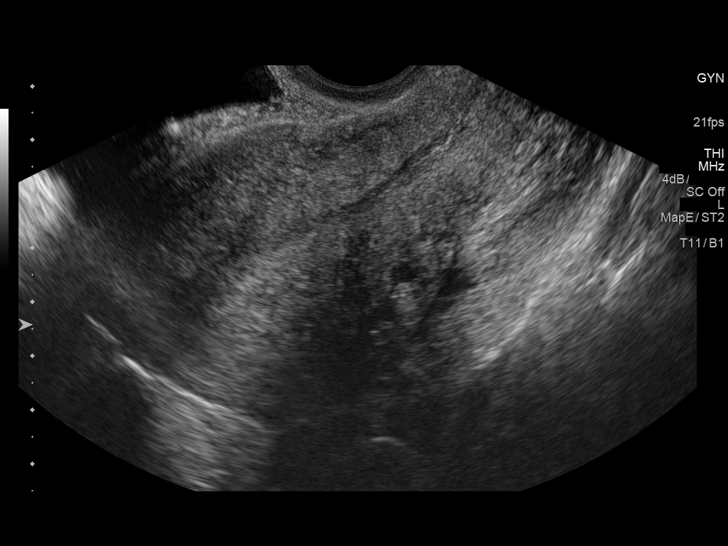
[im 69/103]
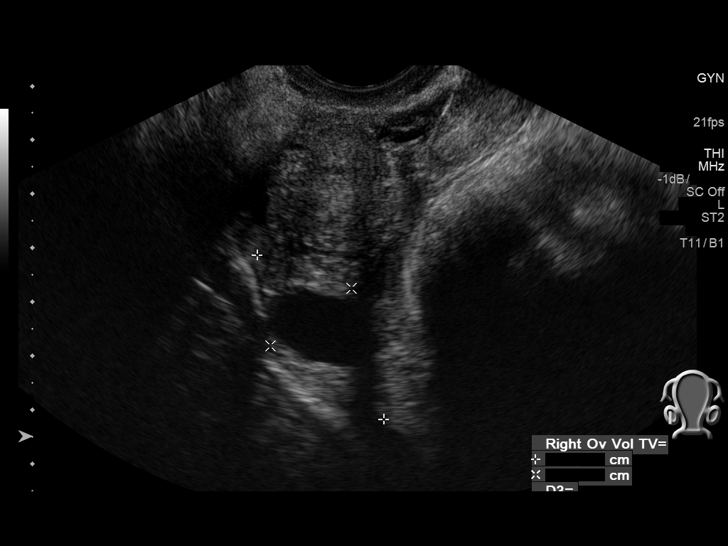
[im 77/103]
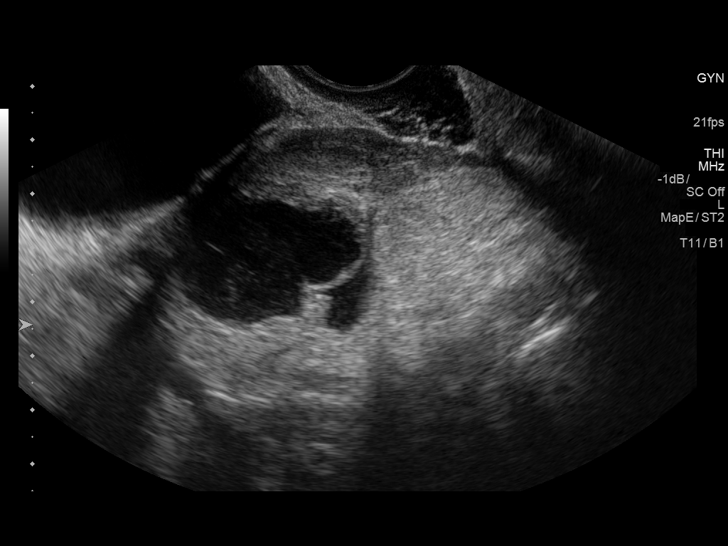
[im 86/103]
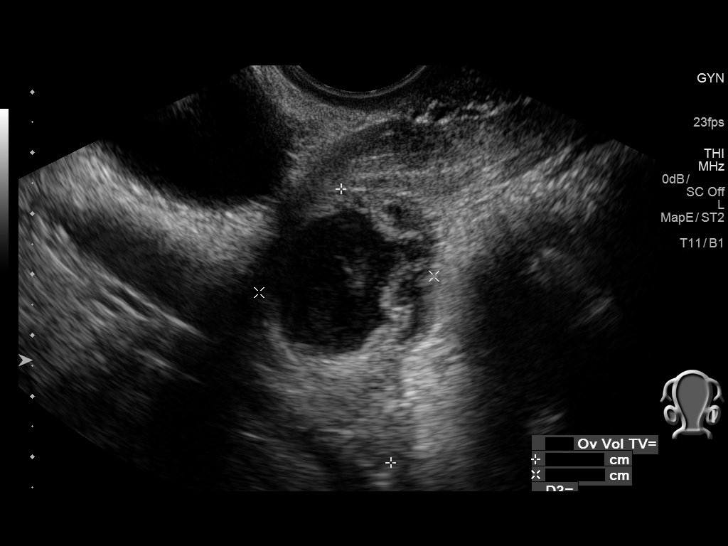
[im 94/103]
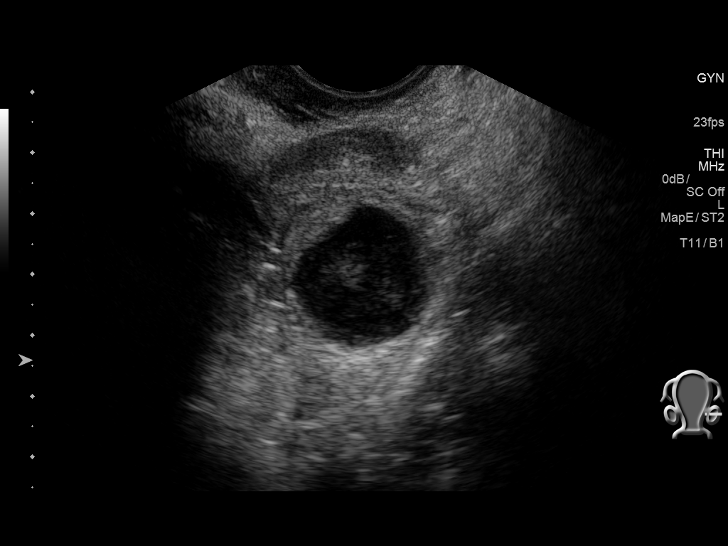
[im 103/103]
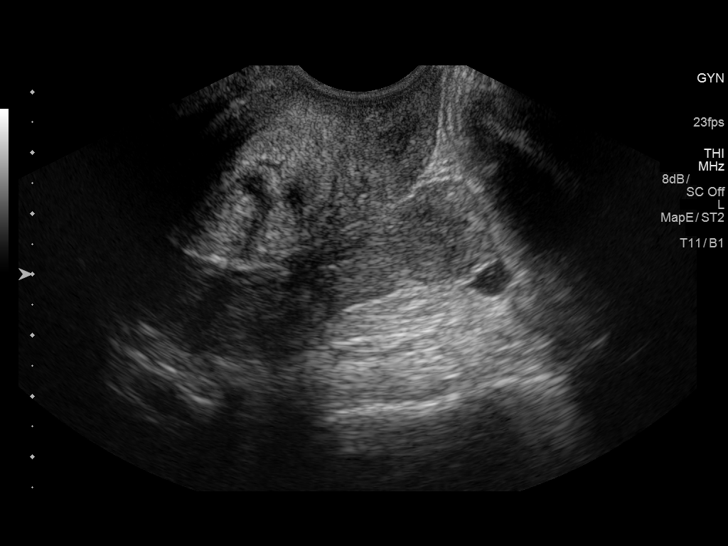

[13 of 25 positions shown; findings below may reference images not displayed]

FINDINGS: Uterus

Measurements: 11.6 x 4.8 x 6.3 cm. No fibroids or other mass
visualized.

Endometrium

Thickness: 1.4 cm.  No focal abnormality visualized.

Right ovary

Measurements: 3.8 x 1.8 x 1.5 cm. Normal appearance/no adnexal mass.

Left ovary

Measurements: 4.6 x 2.9 x 3.0 cm. There is a mildly complex left
ovarian cyst measuring 3.2 x 2.3 x 2.2 cm, with internal septation
and scattered echoes, likely reflecting a hemorrhagic cyst.

Pulsed Doppler evaluation of both ovaries demonstrates normal
low-resistance arterial and venous waveforms.

Other findings

A small to moderate amount of blood is noted tracking within the
pelvic cul-de-sac. With ultrasound correlation, this is also
characterized on recent CT.
IMPRESSION: Left-sided hemorrhagic cyst noted, measuring 3.2 cm. Small to
moderate amount of blood tracking within the pelvic cul-de-sac,
likely reflecting rupture of the hemorrhagic cyst. No evidence for
ovarian torsion. Uterus unremarkable in appearance.

These results were called by telephone at the time of interpretation
on 11/18/2015 at [DATE] to Dr. JEROME HA, who verbally acknowledged
these results.

## 2021-10-16 DIAGNOSIS — L659 Nonscarring hair loss, unspecified: Secondary | ICD-10-CM | POA: Insufficient documentation

## 2021-11-13 ENCOUNTER — Ambulatory Visit
Admission: EM | Admit: 2021-11-13 | Discharge: 2021-11-13 | Disposition: A | Discharge status: Home / Self Care | Payer: Managed Care, Other (non HMO) | Attending: Medical Oncology | Admitting: Medical Oncology

## 2021-11-13 ENCOUNTER — Other Ambulatory Visit: Payer: Self-pay

## 2021-11-13 ENCOUNTER — Encounter: Payer: Self-pay | Admitting: Emergency Medicine

## 2021-11-13 DIAGNOSIS — R051 Acute cough: Secondary | ICD-10-CM | POA: Diagnosis not present

## 2021-11-13 MED ORDER — FLUTICASONE PROPIONATE 50 MCG/ACT NA SUSP: 2.0000 | Freq: Every day | NASAL | 0 refills | Status: AC

## 2021-11-13 MED ORDER — ALBUTEROL SULFATE HFA 108 (90 BASE) MCG/ACT IN AERS: 1.0000 | INHALATION_SPRAY | Freq: Four times a day (QID) | RESPIRATORY_TRACT | 0 refills | Status: DC | PRN

## 2021-11-13 MED ORDER — BENZONATATE 100 MG PO CAPS: 100.0000 mg | ORAL_CAPSULE | Freq: Three times a day (TID) | ORAL | 0 refills | Status: DC

## 2021-11-13 MED ORDER — PREDNISONE 10 MG PO TABS: ORAL_TABLET | ORAL | 0 refills | Status: DC

## 2021-11-13 NOTE — ED Provider Notes (Addendum)
Roderic Palau    CSN: [id number] Arrival date & time: 11/13/21  1940      History   Chief Complaint Chief Complaint  Patient presents with   Cough   Hoarse    HPI Tammy James is a 50 y.o. female.   HPI  Cold Symptoms: Patient reports that they have had symptoms of dry cough and hoarseness for the past 3 days. Symptoms are stable. They deny SOB, chest pain, fever or vomiting. They have tried OTC cough and cold medication for symptoms. No known sick contacts. She is concerned about bronchitis as she has had this before and current symptoms feel similar. No history of asthma, COPD, pneumonia and no recent hospitalizations or procedures.    History reviewed. No pertinent past medical history.  There are no problems to display for this patient.   History reviewed. No pertinent surgical history.  OB History   No obstetric history on file.      Home Medications    Prior to Admission medications   Medication Sig Start Date End Date Taking? Authorizing Provider  HYDROcodone-acetaminophen (NORCO/VICODIN) 5-325 MG tablet Take 1 tablet by mouth every 6 (six) hours as needed for moderate pain. 11/18/15   Delman Kitten, MD  ondansetron (ZOFRAN ODT) 4 MG disintegrating tablet Take 1 tablet (4 mg total) by mouth every 6 (six) hours as needed for nausea or vomiting. 11/18/15   Delman Kitten, MD    Family History Family History  Family history unknown: Yes    Social History Social History   Tobacco Use   Smoking status: Never  Substance Use Topics   Alcohol use: No   Drug use: No     Allergies   Sulfa antibiotics and Alcohol-sulfur [elemental sulfur]   Review of Systems Review of Systems  As stated above in HPI Physical Exam Triage Vital Signs ED Triage Vitals  Enc Vitals Group     BP 11/13/21 1953 (!) 142/82     Pulse Rate 11/13/21 1953 (!) 115     Resp 11/13/21 1953 20     Temp 11/13/21 1953 99.7 F (37.6 C)     Temp Source 11/13/21 1953 Oral     SpO2  11/13/21 1953 98 %     Weight --      Height --      Head Circumference --      Peak Flow --      Pain Score 11/13/21 1955 3     Pain Loc --      Pain Edu? --      Excl. in [place]? --    No data found.  Updated Vital Signs BP (!) 142/82    Pulse (!) 115    Temp 99.7 F (37.6 C) (Oral)    Resp 20    SpO2 98%   Physical Exam Vitals and nursing note reviewed.  Constitutional:      General: She is not in acute distress.    Appearance: Normal appearance. She is not ill-appearing, toxic-appearing or diaphoretic.     Comments: Wheezy like cough  HENT:     Head: Normocephalic and atraumatic.     Right Ear: Tympanic membrane normal.     Left Ear: Tympanic membrane normal.     Nose: Congestion and rhinorrhea (mild clear) present.     Mouth/Throat:     Mouth: Mucous membranes are moist.     Pharynx: Oropharynx is clear. No oropharyngeal exudate or posterior oropharyngeal erythema.  Eyes:  Extraocular Movements: Extraocular movements intact.     Conjunctiva/sclera: Conjunctivae normal.     Pupils: Pupils are equal, round, and reactive to light.  Cardiovascular:     Rate and Rhythm: Normal rate and regular rhythm.     Heart sounds: Normal heart sounds.  Pulmonary:     Effort: Pulmonary effort is normal. No respiratory distress.     Breath sounds: No stridor.     Comments: High pitched cough with deep breathing exercises Chest:     Chest wall: No tenderness.  Musculoskeletal:     Cervical back: Normal range of motion and neck supple.  Lymphadenopathy:     Cervical: No cervical adenopathy.  Skin:    General: Skin is warm.  Neurological:     Mental Status: She is alert and oriented to person, place, and time.     UC Treatments / Results  Labs (all labs ordered are listed, but only abnormal results are displayed) Labs Reviewed - No data to display  EKG   Radiology No results found.  Procedures Procedures (including critical care time)  Medications Ordered in  UC Medications - No data to display  Initial Impression / Assessment and Plan / UC Course  I have reviewed the triage vital signs and the nursing notes.  Pertinent labs & imaging results that were available during my care of the patient were reviewed by me and considered in my medical decision making (see chart for details).     New. Appears to be viral in nature. No X ray tech in office today but patient has no rhonchi on exam and O2 is 98% without known fevers. Risk for pneumonia is low. Viral testing declined. Given extent of cough we have elected to treat for suspected bronchitis. Discussed how to use the medications along with common potential side effects and precautions. Red flag signs and symptoms discussed and reviewed. Follow up PRN.   Final Clinical Impressions(s) / UC Diagnoses   Final diagnoses:  None   Discharge Instructions   None    ED Prescriptions   None    PDMP not reviewed this encounter.   Rushie Chestnut, PA-C 11/13/21 2020    Rushie Chestnut, PA-C 11/13/21 2022

## 2021-11-13 NOTE — ED Triage Notes (Signed)
Pt here with cough x 3 days with hoarseness. Pt concerned for bronchitis.

## 2022-10-01 ENCOUNTER — Ambulatory Visit
Admission: RE | Admit: 2022-10-01 | Discharge: 2022-10-01 | Disposition: A | Discharge status: Home / Self Care | Payer: Managed Care, Other (non HMO) | Source: Ambulatory Visit

## 2022-10-01 VITALS — BP 118/83 | HR 100 | Temp 97.9°F | Resp 15

## 2022-10-01 DIAGNOSIS — J22 Unspecified acute lower respiratory infection: Secondary | ICD-10-CM

## 2022-10-01 DIAGNOSIS — L259 Unspecified contact dermatitis, unspecified cause: Secondary | ICD-10-CM | POA: Insufficient documentation

## 2022-10-01 DIAGNOSIS — M542 Cervicalgia: Secondary | ICD-10-CM | POA: Insufficient documentation

## 2022-10-01 DIAGNOSIS — J209 Acute bronchitis, unspecified: Secondary | ICD-10-CM | POA: Diagnosis not present

## 2022-10-01 DIAGNOSIS — H6060 Unspecified chronic otitis externa, unspecified ear: Secondary | ICD-10-CM | POA: Insufficient documentation

## 2022-10-01 MED ORDER — HYDROCOD POLI-CHLORPHE POLI ER 10-8 MG/5ML PO SUER: 5.0000 mL | Freq: Two times a day (BID) | ORAL | 0 refills | Status: AC | PRN

## 2022-10-01 MED ORDER — AZITHROMYCIN 250 MG PO TABS: 250.0000 mg | ORAL_TABLET | Freq: Every day | ORAL | 0 refills | Status: DC

## 2022-10-01 MED ORDER — BENZONATATE 100 MG PO CAPS: ORAL_CAPSULE | ORAL | 0 refills | Status: DC

## 2022-10-01 MED ORDER — PREDNISONE 10 MG PO TABS: ORAL_TABLET | ORAL | 0 refills | Status: DC

## 2022-10-01 NOTE — ED Triage Notes (Signed)
Pt. Presents to UC w/ c/o a  productive cough for almost 2 months.

## 2022-10-01 NOTE — ED Provider Notes (Addendum)
Renaldo Fiddler    CSN: [id number] Arrival date & time: 10/01/22  0815      History   Chief Complaint Chief Complaint  Patient presents with   Cough    HPI Tammy James is a 51 y.o. female.    Cough   Presents to UC with complaint of productive cough that she states is present for almost 2 months.  She endorses a nagging cough that is productive.  Worsening symptoms in the last few days.  She denies nasal congestion, sinus pressure pain.  Endorses sore scratchy throat.  Denies fever, myalgias, chills.  Denies nausea/vomiting/diarrhea.  No past medical history on file.  Patient Active Problem List   Diagnosis Date Noted   Chronic otitis externa 10/01/2022   Contact dermatitis 10/01/2022   Neck pain 10/01/2022   Alopecia 10/16/2021   Acute sinusitis with symptoms > 10 days 10/25/2014   Metatarsalgia 06/14/2013   Abnormal pregnancy 02/20/2012   Ectopic pregnancy 02/20/2012   Bilateral impacted cerumen 06/16/2005    No past surgical history on file.  OB History   No obstetric history on file.      Home Medications    Prior to Admission medications   Medication Sig Start Date End Date Taking? Authorizing Provider  Doxycycline Hyclate 50 MG TABS Take by mouth. 05/15/22  Yes [provider]  ibuprofen (ADVIL) 600 MG tablet Take by mouth. 02/20/12  Yes [provider]  albuterol (VENTOLIN HFA) 108 (90 Base) MCG/ACT inhaler Inhale 1-2 puffs into the lungs every 6 (six) hours as needed for wheezing or shortness of breath. 11/13/21   Rushie Chestnut, PA-C  benzonatate (TESSALON) 100 MG capsule Take 1 capsule (100 mg total) by mouth every 8 (eight) hours. 11/13/21   Rushie Chestnut, PA-C  clobetasol (TEMOVATE) 0.05 % external solution SMARTSIG:sparingly Topical Daily PRN    [provider]  ferrous sulfate 325 (65 FE) MG tablet Take by mouth.    [provider]  fluticasone (FLONASE) 50 MCG/ACT nasal spray Place 2 sprays into  both nostrils daily. 11/13/21   Rushie Chestnut, PA-C  GAVILYTE-G 236 g solution Take by mouth as directed.    [provider]  HYDROcodone-acetaminophen (NORCO/VICODIN) 5-325 MG tablet Take 1 tablet by mouth every 6 (six) hours as needed for moderate pain. 11/18/15   Sharyn Creamer, MD  KURVELO 0.15-30 MG-MCG tablet Take 1 tablet by mouth daily.    [provider]  mometasone (ELOCON) 0.1 % cream mometasone 0.1 % topical cream  Apply a thin film to the affected skin areas by topical route 1-2 times per week for maintenance    [provider]  Multiple Vitamins-Iron (DAILY-VITE/IRON/BETA-CAROTENE) TABS Take by mouth.    [provider]  ondansetron (ZOFRAN ODT) 4 MG disintegrating tablet Take 1 tablet (4 mg total) by mouth every 6 (six) hours as needed for nausea or vomiting. 11/18/15   Sharyn Creamer, MD  predniSONE (DELTASONE) 10 MG tablet Take 4 tablets by mouth with breakfast for 2 days, 2 tablets by mouth for 2 days and 1 tablet by mouth for 2 days. 11/13/21   Rushie Chestnut, PA-C    Family History Family History  Family history unknown: Yes    Social History Social History   Tobacco Use   Smoking status: Never  Substance Use Topics   Alcohol use: No   Drug use: No     Allergies   Isopropyl alcohol, Minoxidil, Sulfa antibiotics, and Alcohol-sulfur [elemental sulfur]  Review of Systems Review of Systems  Respiratory:  Positive for cough.      Physical Exam Triage Vital Signs ED Triage Vitals [10/01/22 0831]  Enc Vitals Group     BP 118/83     Pulse Rate 100     Resp 15     Temp 97.9 F (36.6 C)     Temp src      SpO2 96 %     Weight      Height      Head Circumference      Peak Flow      Pain Score 0     Pain Loc      Pain Edu?      Excl. in [place]?    No data found.  Updated Vital Signs BP 118/83   Pulse 100   Temp 97.9 F (36.6 C)   Resp 15   SpO2 96%   Visual Acuity Right Eye Distance:   Left Eye Distance:    Bilateral Distance:    Right Eye Near:   Left Eye Near:    Bilateral Near:     Physical Exam Vitals reviewed.  Constitutional:      Appearance: Normal appearance.  Cardiovascular:     Rate and Rhythm: Normal rate and regular rhythm.     Pulses: Normal pulses.     Heart sounds: Normal heart sounds.  Pulmonary:     Effort: Pulmonary effort is normal.     Breath sounds: Normal breath sounds. Transmitted upper airway sounds present. No wheezing.     Comments: Harsh bronchial cough is present during exam and triggered by deep breaths. Skin:    General: Skin is warm and dry.  Neurological:     General: No focal deficit present.     Mental Status: She is alert and oriented to person, place, and time.  Psychiatric:        Mood and Affect: Mood normal.        Behavior: Behavior normal.      UC Treatments / Results  Labs (all labs ordered are listed, but only abnormal results are displayed) Labs Reviewed - No data to display  EKG   Radiology No results found.  Procedures Procedures (including critical care time)  Medications Ordered in UC Medications - No data to display  Initial Impression / Assessment and Plan / UC Course  I have reviewed the triage vital signs and the nursing notes.  Pertinent labs & imaging results that were available during my care of the patient were reviewed by me and considered in my medical decision making (see chart for details).   Patient is afebrile here without recent antipyretics. Satting well on room air. Overall is well appearing, well hydrated, without respiratory distress. Pulmonary exam is remarkable only for harsh bronchial cough present during exam and worsened by deep breaths.  Lungs CTAB.  Concern for long duration of cough and will treat with azithromycin to cover any strep infection as well as atypical pneumonias.  Will order prednisone taper to reduce bronchial inflammation and calm cough.    Final Clinical Impressions(s) /  UC Diagnoses   Final diagnoses:  None   Discharge Instructions   None    ED Prescriptions   None    PDMP not reviewed this encounter.   Rose Phi, FNP 10/01/22 0845    Rose Phi, [place] 10/01/22 [phone]

## 2022-10-01 NOTE — Discharge Instructions (Signed)
Follow up here or with your primary care provider if your symptoms are worsening or not improving with treatment.     

## 2023-07-13 DIAGNOSIS — R21 Rash and other nonspecific skin eruption: Secondary | ICD-10-CM | POA: Diagnosis not present

## 2023-07-13 DIAGNOSIS — Z01411 Encounter for gynecological examination (general) (routine) with abnormal findings: Secondary | ICD-10-CM | POA: Diagnosis not present

## 2024-06-13 ENCOUNTER — Ambulatory Visit
Admission: EM | Admit: 2024-06-13 | Discharge: 2024-06-13 | Disposition: A | Discharge status: Home / Self Care | Payer: Self-pay | Attending: Emergency Medicine | Admitting: Emergency Medicine

## 2024-06-13 ENCOUNTER — Encounter: Payer: Self-pay | Admitting: Emergency Medicine

## 2024-06-13 DIAGNOSIS — J208 Acute bronchitis due to other specified organisms: Secondary | ICD-10-CM

## 2024-06-13 DIAGNOSIS — J209 Acute bronchitis, unspecified: Secondary | ICD-10-CM

## 2024-06-13 DIAGNOSIS — J22 Unspecified acute lower respiratory infection: Secondary | ICD-10-CM

## 2024-06-13 MED ORDER — AZITHROMYCIN 250 MG PO TABS: 250.0000 mg | ORAL_TABLET | Freq: Every day | ORAL | 0 refills | Status: DC

## 2024-06-13 MED ORDER — BENZONATATE 100 MG PO CAPS: ORAL_CAPSULE | ORAL | 0 refills | Status: DC

## 2024-06-13 MED ORDER — ALBUTEROL SULFATE HFA 108 (90 BASE) MCG/ACT IN AERS: 1.0000 | INHALATION_SPRAY | RESPIRATORY_TRACT | 0 refills | Status: DC | PRN

## 2024-06-13 MED ORDER — PROMETHAZINE-DM 6.25-15 MG/5ML PO SYRP: 5.0000 mL | ORAL_SOLUTION | Freq: Four times a day (QID) | ORAL | 0 refills | Status: DC | PRN

## 2024-06-13 NOTE — ED Triage Notes (Signed)
 Patient complains of cough with mucus and HA x1 day.

## 2024-06-13 NOTE — ED Provider Notes (Signed)
 Tammy James    CSN: [id number] Arrival date & time: 06/13/24  1332      History   Chief Complaint Chief Complaint  Patient presents with   Cough   Headache    HPI Tammy James is a 53 y.o. female.   Patient presents for evaluation of a nonproductive cough and a frontal headache beginning 1 day ago.  Feels as if there is mucus in the throat but difficulty expelling.  Accompanying wheezing.  Difficulty sleeping overnight due to persistent cough.  No known sick contacts.  Has attempted use of Tessalon  which has been helpful.  Denies shortness of breath.  Denies respiratory history, non-smoker, endorses that she gets bronchitis at least once yearly.  History reviewed. No pertinent past medical history.  Patient Active Problem List   Diagnosis Date Noted   Chronic otitis externa 10/01/2022   Contact dermatitis 10/01/2022   Neck pain 10/01/2022   Alopecia 10/16/2021   Acute sinusitis with symptoms > 10 days 10/25/2014   Metatarsalgia 06/14/2013   Abnormal pregnancy 02/20/2012   Ectopic pregnancy 02/20/2012   Bilateral impacted cerumen 06/16/2005    History reviewed. No pertinent surgical history.  OB History   No obstetric history on file.      Home Medications    Prior to Admission medications   Medication Sig Start Date End Date Taking? Authorizing Provider  promethazine -dextromethorphan (PROMETHAZINE -DM) 6.25-15 MG/5ML syrup Take 5 mLs by mouth 4 (four) times daily as needed. 06/13/24  Yes Myeesha Shane, Shelba SAUNDERS, NP  albuterol  (VENTOLIN  HFA) 108 (90 Base) MCG/ACT inhaler Inhale 1-2 puffs into the lungs every 4 (four) hours as needed for wheezing. 06/13/24   Tammy Shelba SAUNDERS, NP  azithromycin  (ZITHROMAX ) 250 MG tablet Take 1 tablet (250 mg total) by mouth daily. Take first 2 tablets together, then 1 every day until finished. 06/13/24   Tammy Shelba SAUNDERS, NP  benzonatate  (TESSALON ) 100 MG capsule Take 1-2 tablets 3 times a day as needed for cough 06/13/24   Lavonna Lampron,  Shelba R, NP  clobetasol (TEMOVATE) 0.05 % external solution SMARTSIG:sparingly Topical Daily PRN    [provider]  Doxycycline Hyclate 50 MG TABS Take by mouth. 05/15/22   [provider]  ferrous sulfate 325 (65 FE) MG tablet Take by mouth.    [provider]  fluticasone  (FLONASE ) 50 MCG/ACT nasal spray Place 2 sprays into both nostrils daily. 11/13/21   Tonette Lauraine HERO, PA-C  GAVILYTE-G 236 g solution Take by mouth as directed.    [provider]  HYDROcodone -acetaminophen  (NORCO/VICODIN) 5-325 MG tablet Take 1 tablet by mouth every 6 (six) hours as needed for moderate pain. 11/18/15   Dicky Anes, MD  ibuprofen (ADVIL) 600 MG tablet Take by mouth. 02/20/12   [provider]  KURVELO 0.15-30 MG-MCG tablet Take 1 tablet by mouth daily.    [provider]  mometasone (ELOCON) 0.1 % cream mometasone 0.1 % topical cream  Apply a thin film to the affected skin areas by topical route 1-2 times per week for maintenance    [provider]  Multiple Vitamins-Iron (DAILY-VITE/IRON/BETA-CAROTENE) TABS Take by mouth.    [provider]  ondansetron  (ZOFRAN  ODT) 4 MG disintegrating tablet Take 1 tablet (4 mg total) by mouth every 6 (six) hours as needed for nausea or vomiting. 11/18/15   Dicky Anes, MD  predniSONE  (DELTASONE ) 10 MG tablet Take 4 tablets by mouth with breakfast for 2 days, 2 tablets by mouth for 2 days and 1 tablet  by mouth for 2 days. 10/01/22   Immordino, Garnette, FNP    Family History Family History  Family history unknown: Yes    Social History Social History   Tobacco Use   Smoking status: Never  Substance Use Topics   Alcohol use: No   Drug use: No     Allergies   Isopropyl alcohol, Minoxidil, Sulfa antibiotics, and Alcohol-sulfur [elemental sulfur]   Review of Systems Review of Systems   Physical Exam Triage Vital Signs ED Triage Vitals  Encounter Vitals Group     BP 06/13/24 1433 125/80      Girls Systolic BP Percentile --      Girls Diastolic BP Percentile --      Boys Systolic BP Percentile --      Boys Diastolic BP Percentile --      Pulse Rate 06/13/24 1433 90     Resp 06/13/24 1433 (!) 8     Temp 06/13/24 1433 98.6 F (37 C)     Temp Source 06/13/24 1433 Oral     SpO2 06/13/24 1433 95 %     Weight --      Height --      Head Circumference --      Peak Flow --      Pain Score 06/13/24 1427 6     Pain Loc --      Pain Education --      Exclude from Growth Chart --    No data found.  Updated Vital Signs BP 125/80 (BP Location: Right Arm)   Pulse 90   Temp 98.6 F (37 C) (Oral)   Resp (!) 8   SpO2 95%   Visual Acuity Right Eye Distance:   Left Eye Distance:   Bilateral Distance:    Right Eye Near:   Left Eye Near:    Bilateral Near:     Physical Exam Constitutional:      Appearance: Normal appearance.  HENT:     Head: Normocephalic.     Right Ear: Tympanic membrane, ear canal and external ear normal.     Left Ear: Tympanic membrane, ear canal and external ear normal.     Nose: Nose normal.     Mouth/Throat:     Mouth: Mucous membranes are moist.     Pharynx: Oropharynx is clear.  Eyes:     Extraocular Movements: Extraocular movements intact.  Cardiovascular:     Rate and Rhythm: Normal rate and regular rhythm.     Pulses: Normal pulses.     Heart sounds: Normal heart sounds.  Pulmonary:     Effort: Pulmonary effort is normal.     Breath sounds: Wheezing present.  Neurological:     Mental Status: She is alert and oriented to person, place, and time. Mental status is at baseline.      UC Treatments / Results  Labs (all labs ordered are listed, but only abnormal results are displayed) Labs Reviewed - No data to display  EKG   Radiology No results found.  Procedures Procedures (including critical care time)  Medications Ordered in UC Medications - No data to display  Initial Impression / Assessment and Plan / UC Course   I have reviewed the triage vital signs and the nursing notes.  Pertinent labs & imaging results that were available during my care of the patient were reviewed by me and considered in my medical decision making (see chart for details).  Viral bronchitis  Vital signs are stable, patient  in no signs of distress nontoxic-appearing, wheezing heard to auscultation within all lobes, O2 saturation 95% on room air, stable for outpatient management, deferring imaging at this time, symptoms present for 1 day etiology most likely viral, discussed this with patient, declined viral testing, empirically placed on azithromycin  as patient is requesting antibiotics discussed that this is a preventative measure as symptoms are most likely viral, declined steroids prescribed albuterol , Tessalon  and Promethazine  DM, recommended over-the-counter medications and nonpharmacological supportive care and advised follow-up as needed Final Clinical Impressions(s) / UC Diagnoses   Final diagnoses:  Viral bronchitis     Discharge Instructions      Being treated for bronchitis which is an inflammatory condition of the upper airways, symptoms have been present for 1 day therefore your symptoms today are most likely being caused by a virus and should steadily improve in time it can take up to 7 to 10 days before you truly start to see a turnaround however things will get better  Take azithromycin  which is an antibiotic to prevent symptoms from worsening to a more serious lung condition such as pneumonia  You may use inhaler taking 2 puffs every 4 hours as needed for wheezing or shortness of breath  You may use Tessalon  pill every 8 hours as needed for cough, may use cough syrup every 6 hours but please be mindful this can make some people feel drowsy    You can take Tylenol  and/or Ibuprofen as needed for fever reduction and pain relief.   For cough: honey 1/2 to 1 teaspoon (you can dilute the honey in water or another  fluid).  You can also use guaifenesin and dextromethorphan for cough. You can use a humidifier for chest congestion and cough.  If you don't have a humidifier, you can sit in the bathroom with the hot shower running.      For sore throat: try warm salt water gargles, cepacol lozenges, throat spray, warm tea or water with lemon/honey, popsicles or ice, or OTC cold relief medicine for throat discomfort.   For congestion: take a daily anti-histamine like Zyrtec, Claritin, and a oral decongestant, such as pseudoephedrine.  You can also use Flonase  1-2 sprays in each nostril daily.   It is important to stay hydrated: drink plenty of fluids (water, gatorade/powerade/pedialyte, juices, or teas) to keep your throat moisturized and help further relieve irritation/discomfort.   If your symptoms continue to persist or you begin to experience shortness of breath please seek out reevaluation as you may need steroids    ED Prescriptions     Medication Sig Dispense Auth. Provider   albuterol  (VENTOLIN  HFA) 108 (90 Base) MCG/ACT inhaler Inhale 1-2 puffs into the lungs every 4 (four) hours as needed for wheezing. 8 each Tammy Price R, NP   azithromycin  (ZITHROMAX ) 250 MG tablet Take 1 tablet (250 mg total) by mouth daily. Take first 2 tablets together, then 1 every day until finished. 6 tablet Hai Grabe R, NP   benzonatate  (TESSALON ) 100 MG capsule Take 1-2 tablets 3 times a day as needed for cough 30 capsule Suman Trivedi R, NP   promethazine -dextromethorphan (PROMETHAZINE -DM) 6.25-15 MG/5ML syrup Take 5 mLs by mouth 4 (four) times daily as needed. 118 mL Elajah Kunsman, Price SAUNDERS, NP      PDMP not reviewed this encounter.   Tammy Price SAUNDERS, NP 06/13/24 1600

## 2024-06-13 NOTE — Discharge Instructions (Signed)
 Being treated for bronchitis which is an inflammatory condition of the upper airways, symptoms have been present for 1 day therefore your symptoms today are most likely being caused by a virus and should steadily improve in time it can take up to 7 to 10 days before you truly start to see a turnaround however things will get better  Take azithromycin  which is an antibiotic to prevent symptoms from worsening to a more serious lung condition such as pneumonia  You may use inhaler taking 2 puffs every 4 hours as needed for wheezing or shortness of breath  You may use Tessalon  pill every 8 hours as needed for cough, may use cough syrup every 6 hours but please be mindful this can make some people feel drowsy    You can take Tylenol  and/or Ibuprofen as needed for fever reduction and pain relief.   For cough: honey 1/2 to 1 teaspoon (you can dilute the honey in water or another fluid).  You can also use guaifenesin and dextromethorphan for cough. You can use a humidifier for chest congestion and cough.  If you don't have a humidifier, you can sit in the bathroom with the hot shower running.      For sore throat: try warm salt water gargles, cepacol lozenges, throat spray, warm tea or water with lemon/honey, popsicles or ice, or OTC cold relief medicine for throat discomfort.   For congestion: take a daily anti-histamine like Zyrtec, Claritin, and a oral decongestant, such as pseudoephedrine.  You can also use Flonase  1-2 sprays in each nostril daily.   It is important to stay hydrated: drink plenty of fluids (water, gatorade/powerade/pedialyte, juices, or teas) to keep your throat moisturized and help further relieve irritation/discomfort.   If your symptoms continue to persist or you begin to experience shortness of breath please seek out reevaluation as you may need steroids

## 2024-07-01 ENCOUNTER — Ambulatory Visit
Admission: EM | Admit: 2024-07-01 | Discharge: 2024-07-01 | Disposition: A | Discharge status: Home / Self Care | Payer: Self-pay | Attending: Emergency Medicine | Admitting: Emergency Medicine

## 2024-07-01 ENCOUNTER — Encounter: Payer: Self-pay | Admitting: Emergency Medicine

## 2024-07-01 DIAGNOSIS — R062 Wheezing: Secondary | ICD-10-CM

## 2024-07-01 DIAGNOSIS — J209 Acute bronchitis, unspecified: Secondary | ICD-10-CM

## 2024-07-01 MED ORDER — AMOXICILLIN-POT CLAVULANATE 875-125 MG PO TABS: 1.0000 | ORAL_TABLET | Freq: Two times a day (BID) | ORAL | 0 refills | Status: AC

## 2024-07-01 MED ORDER — GUAIFENESIN-CODEINE 100-10 MG/5ML PO SOLN: 5.0000 mL | Freq: Every evening | ORAL | 0 refills | Status: DC | PRN

## 2024-07-01 MED ORDER — BENZONATATE 100 MG PO CAPS: 100.0000 mg | ORAL_CAPSULE | Freq: Three times a day (TID) | ORAL | 0 refills | Status: DC

## 2024-07-01 MED ORDER — METHYLPREDNISOLONE ACETATE 80 MG/ML IJ SUSP
60.0000 mg | Freq: Once | INTRAMUSCULAR | Status: AC
  Administered 2024-07-01: 60 mg via INTRAMUSCULAR

## 2024-07-01 MED ORDER — PREDNISONE 10 MG (21) PO TBPK: ORAL_TABLET | Freq: Every day | ORAL | 0 refills | Status: DC

## 2024-07-01 NOTE — ED Provider Notes (Signed)
 [facility]AL[facility] PELT    CSN: [id number] Arrival date & time: 07/01/24  0803      History   Chief Complaint Chief Complaint  Patient presents with   Cough    HPI Tammy James is a 53 y.o. female.   Patient presents for evaluation of a persisting productive cough and a rattle in the chest present for 2 weeks.  No known sick contacts prior.  Denies fever, shortness of breath or wheezing, ear pain or sore throat.  Was evaluated in this urgent care, prescribed a Z-Pak, prednisone  and Tessalon , endorses improvement but symptoms not resolved.  History reviewed. No pertinent past medical history.  Patient Active Problem List   Diagnosis Date Noted   Chronic otitis externa 10/01/2022   Contact dermatitis 10/01/2022   Neck pain 10/01/2022   Alopecia 10/16/2021   Acute sinusitis with symptoms > 10 days 10/25/2014   Metatarsalgia 06/14/2013   Abnormal pregnancy 02/20/2012   Ectopic pregnancy 02/20/2012   Bilateral impacted cerumen 06/16/2005    History reviewed. No pertinent surgical history.  OB History   No obstetric history on file.      Home Medications    Prior to Admission medications   Medication Sig Start Date End Date Taking? Authorizing Provider  amoxicillin -clavulanate (AUGMENTIN ) 875-125 MG tablet Take 1 tablet by mouth every 12 (twelve) hours for 10 days. 07/01/24 07/11/24 Yes Zelena Bushong R, NP  benzonatate  (TESSALON ) 100 MG capsule Take 1 capsule (100 mg total) by mouth every 8 (eight) hours. 07/01/24  Yes Veleda Mun R, NP  guaiFENesin -codeine  100-10 MG/5ML syrup Take 5 mLs by mouth at bedtime as needed for cough. 07/01/24  Yes Asriel Westrup R, NP  predniSONE  (STERAPRED UNI-PAK 21 TAB) 10 MG (21) TBPK tablet Take by mouth daily. Take 6 tabs by mouth daily  for 1 days, then 5 tabs for 1 days, then 4 tabs for 1 days, then 3 tabs for 1 days, 2 tabs for 1 days, then 1 tab by mouth daily for 1 days 07/01/24  Yes Bridget Westbrooks, Shelba SAUNDERS, NP  albuterol  (VENTOLIN  HFA)  108 (90 Base) MCG/ACT inhaler Inhale 1-2 puffs into the lungs every 4 (four) hours as needed for wheezing. 06/13/24   Teresa Shelba SAUNDERS, NP  azithromycin  (ZITHROMAX ) 250 MG tablet Take 1 tablet (250 mg total) by mouth daily. Take first 2 tablets together, then 1 every day until finished. 06/13/24   Teresa Shelba SAUNDERS, NP  clobetasol (TEMOVATE) 0.05 % external solution SMARTSIG:sparingly Topical Daily PRN    [provider]  Doxycycline Hyclate 50 MG TABS Take by mouth. 05/15/22   [provider]  ferrous sulfate 325 (65 FE) MG tablet Take by mouth.    [provider]  fluticasone  (FLONASE ) 50 MCG/ACT nasal spray Place 2 sprays into both nostrils daily. 11/13/21   Tonette Lauraine HERO, PA-C  GAVILYTE-G 236 g solution Take by mouth as directed.    [provider]  HYDROcodone -acetaminophen  (NORCO/VICODIN) 5-325 MG tablet Take 1 tablet by mouth every 6 (six) hours as needed for moderate pain. 11/18/15   Dicky Anes, MD  ibuprofen (ADVIL) 600 MG tablet Take by mouth. 02/20/12   [provider]  KURVELO 0.15-30 MG-MCG tablet Take 1 tablet by mouth daily.    [provider]  mometasone (ELOCON) 0.1 % cream mometasone 0.1 % topical cream  Apply a thin film to the affected skin areas by topical route 1-2 times per week for maintenance    [provider]  Multiple Vitamins-Iron (  DAILY-VITE/IRON/BETA-CAROTENE) TABS Take by mouth.    [provider]  ondansetron (ZOFRAN ODT) 4 MG disintegrating tablet Take 1 tablet (4 mg total) by mouth every 6 (six) hours as needed for nausea or vomiting. 11/18/15   Dicky Anes, MD  promethazine-dextromethorphan (PROMETHAZINE-DM) 6.25-15 MG/5ML syrup Take 5 mLs by mouth 4 (four) times daily as needed. 06/13/24   Teresa Shelba SAUNDERS, NP    Family History Family History  Family history unknown: Yes    Social History Social History   Tobacco Use   Smoking status: Never  Substance Use Topics   Alcohol use: No    Drug use: No     Allergies   Isopropyl alcohol, Minoxidil, Sulfa antibiotics, and Alcohol-sulfur [elemental sulfur]   Review of Systems Review of Systems   Physical Exam Triage Vital Signs ED Triage Vitals  Encounter Vitals Group     BP 07/01/24 0818 121/81     Girls Systolic BP Percentile --      Girls Diastolic BP Percentile --      Boys Systolic BP Percentile --      Boys Diastolic BP Percentile --      Pulse Rate 07/01/24 0818 93     Resp 07/01/24 0818 18     Temp 07/01/24 0818 98.3 F (36.8 C)     Temp Source 07/01/24 0818 Oral     SpO2 07/01/24 0814 97 %     Weight --      Height --      Head Circumference --      Peak Flow --      Pain Score 07/01/24 0814 0     Pain Loc --      Pain Education --      Exclude from Growth Chart --    No data found.  Updated Vital Signs BP 121/81 (BP Location: Right Arm)   Pulse 93   Temp 98.3 F (36.8 C) (Oral)   Resp 18   SpO2 97%   Visual Acuity Right Eye Distance:   Left Eye Distance:   Bilateral Distance:    Right Eye Near:   Left Eye Near:    Bilateral Near:     Physical Exam Constitutional:      Appearance: Normal appearance.  Eyes:     Extraocular Movements: Extraocular movements intact.  Cardiovascular:     Rate and Rhythm: Normal rate and regular rhythm.     Pulses: Normal pulses.     Heart sounds: Normal heart sounds.  Pulmonary:     Effort: Pulmonary effort is normal.     Breath sounds: Wheezing present.  Neurological:     Mental Status: She is alert and oriented to person, place, and time.      UC Treatments / Results  Labs (all labs ordered are listed, but only abnormal results are displayed) Labs Reviewed - No data to display  EKG   Radiology No results found.  Procedures Procedures (including critical care time)  Medications Ordered in UC Medications  methylPREDNISolone acetate (DEPO-MEDROL) injection 60 mg (60 mg Intramuscular Given 07/01/24 0844)    Initial Impression /  Assessment and Plan / UC Course  I have reviewed the triage vital signs and the nursing notes.  Pertinent labs & imaging results that were available during my care of the patient were reviewed by me and considered in my medical decision making (see chart for details).  Acute bronchitis, wheezing  Patient is in no signs of distress nor toxic appearing.  Vital signs are stable.  Low suspicion for pneumonia and therefore will defer imaging.  Viral testing deferred due to timeline wheezing present on exam saturation greater than 90%, patient in no signs of distress nontoxic, stable for outpatient management.  Methylprednisolone IM given, prescribed Augmentin, prednisone and guaifenesin codeine, PDMP reviewed, low risk. May use additional over-the-counter medications as needed for supportive care.  May follow-up with urgent care as needed if symptoms persist or worsen.   Final Clinical Impressions(s) / UC Diagnoses   Final diagnoses:  Acute bronchitis, unspecified organism  Wheezing     Discharge Instructions      Take Augmentin twice daily for 10 days  You have been given an injection of steroids to open and relax the airway as wheezing was heard on  Starting tomorrow take steroid pills every morning with food as directed  You may use Tessalon Perles every 8 hours for cough, may use cough syrup at bedtime to allow for rest    You can take Tylenol and/or Ibuprofen as needed for fever reduction and pain relief.   For cough: honey 1/2 to 1 teaspoon (you can dilute the honey in water or another fluid).  You can also use guaifenesin and dextromethorphan for cough. You can use a humidifier for chest congestion and cough.  If you don't have a humidifier, you can sit in the bathroom with the hot shower running.      For sore throat: try warm salt water gargles, cepacol lozenges, throat spray, warm tea or water with lemon/honey, popsicles or ice, or OTC cold relief medicine for throat  discomfort.   For congestion: take a daily anti-histamine like Zyrtec, Claritin, and a oral decongestant, such as pseudoephedrine.  You can also use Flonase 1-2 sprays in each nostril daily.   It is important to stay hydrated: drink plenty of fluids (water, gatorade/powerade/pedialyte, juices, or teas) to keep your throat moisturized and help further relieve irritation/discomfort.    ED Prescriptions     Medication Sig Dispense Auth. Provider   amoxicillin-clavulanate (AUGMENTIN) 875-125 MG tablet Take 1 tablet by mouth every 12 (twelve) hours for 10 days. 20 tablet Viana Sleep R, NP   predniSONE (STERAPRED UNI-PAK 21 TAB) 10 MG (21) TBPK tablet Take by mouth daily. Take 6 tabs by mouth daily  for 1 days, then 5 tabs for 1 days, then 4 tabs for 1 days, then 3 tabs for 1 days, 2 tabs for 1 days, then 1 tab by mouth daily for 1 days 21 tablet Ciana Simmon R, NP   benzonatate (TESSALON) 100 MG capsule Take 1 capsule (100 mg total) by mouth every 8 (eight) hours. 21 capsule Dempsey Knotek R, NP   guaiFENesin-codeine 100-10 MG/5ML syrup Take 5 mLs by mouth at bedtime as needed for cough. 120 mL Clarence Dunsmore, Shelba SAUNDERS, NP      I have reviewed the PDMP during this encounter.   Teresa Shelba SAUNDERS, [place] 07/01/24 [phone]

## 2024-07-01 NOTE — Discharge Instructions (Signed)
 Take Augmentin  twice daily for 10 days  You have been given an injection of steroids to open and relax the airway as wheezing was heard on  Starting tomorrow take steroid pills every morning with food as directed  You may use Tessalon  Perles every 8 hours for cough, may use cough syrup at bedtime to allow for rest    You can take Tylenol  and/or Ibuprofen as needed for fever reduction and pain relief.   For cough: honey 1/2 to 1 teaspoon (you can dilute the honey in water or another fluid).  You can also use guaifenesin  and dextromethorphan for cough. You can use a humidifier for chest congestion and cough.  If you don't have a humidifier, you can sit in the bathroom with the hot shower running.      For sore throat: try warm salt water gargles, cepacol lozenges, throat spray, warm tea or water with lemon/honey, popsicles or ice, or OTC cold relief medicine for throat discomfort.   For congestion: take a daily anti-histamine like Zyrtec, Claritin, and a oral decongestant, such as pseudoephedrine.  You can also use Flonase  1-2 sprays in each nostril daily.   It is important to stay hydrated: drink plenty of fluids (water, gatorade/powerade/pedialyte, juices, or teas) to keep your throat moisturized and help further relieve irritation/discomfort.

## 2024-07-01 NOTE — ED Triage Notes (Signed)
 Patient reports cough with clear sputum since 06/12/24. Patient has taken Tylenol  Severe Cough & sore throat night time. With mild relief. Denies pain.

## 2024-09-25 ENCOUNTER — Emergency Department: Payer: Self-pay

## 2024-09-25 ENCOUNTER — Emergency Department
Admission: EM | Admit: 2024-09-25 | Discharge: 2024-09-26 | Disposition: A | Discharge status: Home / Self Care | Payer: Self-pay | Attending: Emergency Medicine | Admitting: Emergency Medicine

## 2024-09-25 ENCOUNTER — Other Ambulatory Visit: Payer: Self-pay

## 2024-09-25 DIAGNOSIS — Y92010 Kitchen of single-family (private) house as the place of occurrence of the external cause: Secondary | ICD-10-CM | POA: Insufficient documentation

## 2024-09-25 DIAGNOSIS — R55 Syncope and collapse: Secondary | ICD-10-CM | POA: Insufficient documentation

## 2024-09-25 DIAGNOSIS — W1830XA Fall on same level, unspecified, initial encounter: Secondary | ICD-10-CM | POA: Insufficient documentation

## 2024-09-25 DIAGNOSIS — J209 Acute bronchitis, unspecified: Secondary | ICD-10-CM | POA: Insufficient documentation

## 2024-09-25 DIAGNOSIS — W19XXXA Unspecified fall, initial encounter: Secondary | ICD-10-CM

## 2024-09-25 DIAGNOSIS — J40 Bronchitis, not specified as acute or chronic: Secondary | ICD-10-CM

## 2024-09-25 DIAGNOSIS — S0083XA Contusion of other part of head, initial encounter: Secondary | ICD-10-CM | POA: Insufficient documentation

## 2024-09-25 DIAGNOSIS — S0093XA Contusion of unspecified part of head, initial encounter: Secondary | ICD-10-CM

## 2024-09-25 LAB — COMPREHENSIVE METABOLIC PANEL WITH GFR
ALT: 31 U/L (ref 0–44)
AST: 24 U/L (ref 15–41)
Albumin: 4.1 g/dL (ref 3.5–5.0)
Alkaline Phosphatase: 98 U/L (ref 38–126)
Anion gap: 14 (ref 5–15)
BUN: 19 mg/dL (ref 6–20)
CO2: 23 mmol/L (ref 22–32)
Calcium: 9.3 mg/dL (ref 8.9–10.3)
Chloride: 103 mmol/L (ref 98–111)
Creatinine, Ser: 1.13 mg/dL — ABNORMAL HIGH (ref 0.44–1.00)
GFR, Estimated: 58 mL/min — ABNORMAL LOW (ref >60)
Glucose, Bld: 110 mg/dL — ABNORMAL HIGH (ref 70–99)
Potassium: 3.8 mmol/L (ref 3.5–5.1)
Sodium: 140 mmol/L (ref 135–145)
Total Bilirubin: 0.6 mg/dL (ref 0.0–1.2)
Total Protein: 7.8 g/dL (ref 6.5–8.1)

## 2024-09-25 LAB — CBC WITH DIFFERENTIAL/PLATELET
Abs Immature Granulocytes: 0.01 K/uL (ref 0.00–0.07)
Basophils Absolute: 0.1 K/uL (ref 0.0–0.1)
Basophils Relative: 1 %
Eosinophils Absolute: 0.3 K/uL (ref 0.0–0.5)
Eosinophils Relative: 4 %
HCT: 38.2 % (ref 36.0–46.0)
Hemoglobin: 11.4 g/dL — ABNORMAL LOW (ref 12.0–15.0)
Immature Granulocytes: 0 %
Lymphocytes Relative: 31 %
Lymphs Abs: 1.9 K/uL (ref 0.7–4.0)
MCH: 22.2 pg — ABNORMAL LOW (ref 26.0–34.0)
MCHC: 29.8 g/dL — ABNORMAL LOW (ref 30.0–36.0)
MCV: 74.3 fL — ABNORMAL LOW (ref 80.0–100.0)
Monocytes Absolute: 0.6 K/uL (ref 0.1–1.0)
Monocytes Relative: 9 %
Neutro Abs: 3.4 K/uL (ref 1.7–7.7)
Neutrophils Relative %: 55 %
Platelets: 274 K/uL (ref 150–400)
RBC: 5.14 MIL/uL — ABNORMAL HIGH (ref 3.87–5.11)
RDW: 13.8 % (ref 11.5–15.5)
WBC: 6.2 K/uL (ref 4.0–10.5)
nRBC: 0 % (ref 0.0–0.2)

## 2024-09-25 MED ORDER — IPRATROPIUM-ALBUTEROL 0.5-2.5 (3) MG/3ML IN SOLN
3.0000 mL | Freq: Once | RESPIRATORY_TRACT | Status: AC
  Administered 2024-09-25: 3 mL via RESPIRATORY_TRACT
  Filled 2024-09-25: qty 3

## 2024-09-25 MED ORDER — BENZONATATE 100 MG PO CAPS: 100.0000 mg | ORAL_CAPSULE | Freq: Three times a day (TID) | ORAL | 0 refills | Status: DC | PRN

## 2024-09-25 MED ORDER — PREDNISONE 50 MG PO TABS: 50.0000 mg | ORAL_TABLET | Freq: Every day | ORAL | 0 refills | Status: DC

## 2024-09-25 NOTE — ED Triage Notes (Addendum)
 Pt to ed from home via POV for a fall. Pt was found in the kitchen floor bt husband. Pt remembers coughing and when she stood up she lost her balance and fell. Pt broke her glasses during her fall. Pt has small hematoma to right side of her forehead. Pt denies blood thinner usage.

## 2024-09-26 NOTE — ED Provider Notes (Signed)
 [facility] Provider Note    Event Date/Time   First MD Initiated Contact with Patient 09/25/24 2326     (approximate)   History   Fall   HPI  Tammy James is a 53 y.o. female who presents after a fall.  Patient reports that she was having heavy coughing and went to stand up and lost her balance and fell.  She did hit her head.  Not on blood thinners.  No neurodeficits.  Feels quite well at this time, no dizziness.  No palpitations, no chest pain, no back pain.     Physical Exam   Triage Vital Signs: ED Triage Vitals  Encounter Vitals Group     BP 09/25/24 2119 (!) 135/90     Girls Systolic BP Percentile --      Girls Diastolic BP Percentile --      Boys Systolic BP Percentile --      Boys Diastolic BP Percentile --      Pulse Rate 09/25/24 2119 79     Resp 09/25/24 2119 16     Temp 09/25/24 2119 99.6 F (37.6 C)     Temp Source 09/25/24 2119 Oral     SpO2 09/25/24 2119 98 %     Weight --      Height 09/25/24 2120 1.6 m (5' 3)     Head Circumference --      Peak Flow --      Pain Score 09/25/24 2120 5     Pain Loc --      Pain Education --      Exclude from Growth Chart --     Most recent vital signs: Vitals:   09/25/24 2334 09/25/24 2357  BP: (!) 147/99   Pulse: 98 93  Resp:    Temp:    SpO2: 100% 100%     General: Awake, no distress.  CV:  Good peripheral perfusion.  No chest wall tenderness palpation Resp:  Normal effort.  Abd:  No distention.  Soft, nontender Other:  Patient with hematoma to the right forehead, small abrasion No pain with axial load on both hips.  Is able to stand without difficulty.  Normal range of motion of all extremities, no vertebral tenderness to palpation.   ED Results / Procedures / Treatments   Labs (all labs ordered are listed, but only abnormal results are displayed) Labs Reviewed  CBC WITH DIFFERENTIAL/PLATELET - Abnormal; Notable for the following components:      Result Value   RBC  5.14 (*)    Hemoglobin 11.4 (*)    MCV 74.3 (*)    MCH 22.2 (*)    MCHC 29.8 (*)    All other components within normal limits  COMPREHENSIVE METABOLIC PANEL WITH GFR - Abnormal; Notable for the following components:   Glucose, Bld 110 (*)    Creatinine, Ser 1.13 (*)    GFR, Estimated 58 (*)    All other components within normal limits     EKG     RADIOLOGY CT head viewed interpret by me, no acute intracranial abnormality, confirmed by radiology    PROCEDURES:  Critical Care performed:   Procedures   MEDICATIONS ORDERED IN ED: Medications  ipratropium-albuterol  (DUONEB) 0.5-2.5 (3) MG/3ML nebulizer solution 3 mL (3 mLs Nebulization Given 09/25/24 2353)     IMPRESSION / MDM / ASSESSMENT AND PLAN / ED COURSE  I reviewed the triage vital signs and the nursing notes. Patient's presentation is most consistent with  acute presentation with potential threat to life or bodily function.  Patient presents after fall with head injury, she blames this on losing her balance because she was coughing extensively when she got up.  Possibility of a vasovagal near syncope but she denies lightheadedness.  She is able to stand without difficulty.  Orthostatics are normal, checked by me.  CT head and cervical spine are reassuring, lab work is unremarkable.  No indication for admission at this time, appropriate for discharge with close outpatient follow-up, return precautions discussed        FINAL CLINICAL IMPRESSION(S) / ED DIAGNOSES   Final diagnoses:  Fall, initial encounter  Contusion of head, unspecified part of head, initial encounter  Near syncope  Bronchitis     Rx / DC Orders   ED Discharge Orders          Ordered    predniSONE  (DELTASONE ) 50 MG tablet  Daily with breakfast        09/25/24 2339    benzonatate  (TESSALON ) 100 MG capsule  3 times daily PRN        09/25/24 2339             Note:  This document was prepared using Dragon voice recognition  software and may include unintentional dictation errors.   Arlander Charleston, MD 09/26/24 [phone]

## 2024-11-16 ENCOUNTER — Ambulatory Visit
Admission: EM | Admit: 2024-11-16 | Discharge: 2024-11-16 | Disposition: A | Discharge status: Home / Self Care | Payer: Self-pay | Attending: Emergency Medicine | Admitting: Emergency Medicine

## 2024-11-16 DIAGNOSIS — J069 Acute upper respiratory infection, unspecified: Secondary | ICD-10-CM

## 2024-11-16 MED ORDER — BENZONATATE 100 MG PO CAPS: 100.0000 mg | ORAL_CAPSULE | Freq: Three times a day (TID) | ORAL | 0 refills | Status: AC | PRN

## 2024-11-16 MED ORDER — AZITHROMYCIN 250 MG PO TABS: 250.0000 mg | ORAL_TABLET | Freq: Every day | ORAL | 0 refills | Status: DC

## 2024-11-16 MED ORDER — PROMETHAZINE-DM 6.25-15 MG/5ML PO SYRP: 5.0000 mL | ORAL_SOLUTION | Freq: Four times a day (QID) | ORAL | 0 refills | Status: AC | PRN

## 2024-11-16 NOTE — ED Triage Notes (Signed)
 Patient to Urgent Care with complaints of  productive cough (discolored mucus).  Symptoms x3-4 weeks. Hx of bronchitis.   Attempted use of tessalon  pearls.

## 2024-11-16 NOTE — ED Provider Notes (Signed)
 " CAY RALPH PELT    CSN: [id number] Arrival date & time: 11/16/24  1523      History   Chief Complaint Chief Complaint  Patient presents with   Cough    HPI Tammy James is a 53 y.o. female.  Patient presents with 3 to 4-week history of cough that is occasionally productive.  No fever or shortness of breath.  She reports history of recurrent bronchitis.  She does not smoke or vape.  She has been treating her symptoms with Tessalon  Perles and albuterol  inhaler.  Patient was seen at this urgent care on 06/13/2024; diagnosed with viral bronchitis; treated with Promethazine  DM and albuterol  inhaler.  She was seen at this urgent care again on 07/01/2024; diagnosed with acute bronchitis and wheezing; treated with Augmentin , codeine  cough syrup, Tessalon  Perles, prednisone  taper.  She was [facility] at [facility] ED on 09/25/2024; she fell after an episode of heavy coughing; she hit her head during the fall; diagnosed with fall, contusion of head, near syncope, bronchitis; treated with Tessalon  Perles and prednisone .  The history is provided by the patient and medical records.    History reviewed. No pertinent past medical history.  Patient Active Problem List   Diagnosis Date Noted   Chronic otitis externa 10/01/2022   Contact dermatitis 10/01/2022   Neck pain 10/01/2022   Alopecia 10/16/2021   Acute sinusitis with symptoms > 10 days 10/25/2014   Metatarsalgia 06/14/2013   Abnormal pregnancy 02/20/2012   Ectopic pregnancy 02/20/2012   Bilateral impacted cerumen 06/16/2005    History reviewed. No pertinent surgical history.  OB History   No obstetric history on file.      Home Medications    Prior to Admission medications  Medication Sig Start Date End Date Taking? Authorizing Provider  azithromycin  (ZITHROMAX ) 250 MG tablet Take 1 tablet (250 mg total) by mouth daily. Take first 2 tablets together, then 1 every day until finished. 11/16/24  Yes Corlis Burnard DEL, NP  benzonatate   (TESSALON ) 100 MG capsule Take 1 capsule (100 mg total) by mouth 3 (three) times daily as needed for cough. 11/16/24  Yes Corlis Burnard DEL, NP  promethazine -dextromethorphan (PROMETHAZINE -DM) 6.25-15 MG/5ML syrup Take 5 mLs by mouth 4 (four) times daily as needed. 11/16/24  Yes Corlis Burnard DEL, NP  clobetasol (TEMOVATE) 0.05 % external solution SMARTSIG:sparingly Topical Daily PRN    [provider]  ferrous sulfate 325 (65 FE) MG tablet Take by mouth.    [provider]  fluticasone  (FLONASE ) 50 MCG/ACT nasal spray Place 2 sprays into both nostrils daily. 11/13/21   Tonette Lauraine HERO, PA-C  GAVILYTE-G 236 g solution Take by mouth as directed.    [provider]  KURVELO 0.15-30 MG-MCG tablet Take 1 tablet by mouth daily.    [provider]  mometasone (ELOCON) 0.1 % cream mometasone 0.1 % topical cream  Apply a thin film to the affected skin areas by topical route 1-2 times per week for maintenance    [provider]  Multiple Vitamins-Iron (DAILY-VITE/IRON/BETA-CAROTENE) TABS Take by mouth.    [provider]  ondansetron  (ZOFRAN  ODT) 4 MG disintegrating tablet Take 1 tablet (4 mg total) by mouth every 6 (six) hours as needed for nausea or vomiting. 11/18/15   Dicky Anes, MD  predniSONE  (DELTASONE ) 50 MG tablet Take 1 tablet (50 mg total) by mouth daily with breakfast. Patient not taking: Reported on 11/16/2024 09/25/24   Arlander Charleston, MD    Family History Family History  Family  history unknown: Yes    Social History Social History[1]   Allergies   Isopropyl alcohol, Minoxidil, Sulfa antibiotics, and Alcohol-sulfur [elemental sulfur]   Review of Systems Review of Systems  Constitutional:  Negative for chills and fever.  HENT:  Negative for ear pain and sore throat.   Respiratory:  Positive for cough. Negative for shortness of breath.   Cardiovascular:  Negative for chest pain and palpitations.     Physical Exam Triage Vital  Signs ED Triage Vitals  Encounter Vitals Group     BP 11/16/24 1634 120/84     Girls Systolic BP Percentile --      Girls Diastolic BP Percentile --      Boys Systolic BP Percentile --      Boys Diastolic BP Percentile --      Pulse Rate 11/16/24 1634 85     Resp 11/16/24 1634 18     Temp 11/16/24 1634 98.1 F (36.7 C)     Temp src --      SpO2 11/16/24 1634 95 %     Weight --      Height --      Head Circumference --      Peak Flow --      Pain Score 11/16/24 1638 0     Pain Loc --      Pain Education --      Exclude from Growth Chart --    No data found.  Updated Vital Signs BP 120/84   Pulse 85   Temp 98.1 F (36.7 C)   Resp 18   SpO2 95%   Visual Acuity Right Eye Distance:   Left Eye Distance:   Bilateral Distance:    Right Eye Near:   Left Eye Near:    Bilateral Near:     Physical Exam Constitutional:      General: She is not in acute distress. HENT:     Right Ear: Tympanic membrane normal.     Left Ear: Tympanic membrane normal.     Nose: Nose normal.     Mouth/Throat:     Mouth: Mucous membranes are moist.     Pharynx: Oropharynx is clear.  Cardiovascular:     Rate and Rhythm: Normal rate and regular rhythm.     Heart sounds: Normal heart sounds.  Pulmonary:     Effort: Pulmonary effort is normal. No respiratory distress.     Breath sounds: Normal breath sounds.  Neurological:     Mental Status: She is alert.      UC Treatments / Results  Labs (all labs ordered are listed, but only abnormal results are displayed) Labs Reviewed - No data to display  EKG   Radiology No results found.  Procedures Procedures (including critical care time)  Medications Ordered in UC Medications - No data to display  Initial Impression / Assessment and Plan / UC Course  I have reviewed the triage vital signs and the nursing notes.  Pertinent labs & imaging results that were available during my care of the patient were reviewed by me and considered  in my medical decision making (see chart for details).   Acute upper respiratory infection.  Afebrile and vital signs are stable.  Lungs are clear and O2 sat is 95% on room air.  Patient has been symptomatic for several weeks.  She declines chest x-ray today.  Treating today with Zithromax , Tessalon  Perles, Promethazine  DM.  Precautions for drowsiness with promethazine  discussed.  Instructed her to  schedule an appointment with her PCP next week.  ED precautions given.  Education provided on upper respiratory infection.  She agrees to plan of care.   Final Clinical Impressions(s) / UC Diagnoses   Final diagnoses:  Acute upper respiratory infection     Discharge Instructions      Take the Zithromax  as directed.  Continue to use your albuterol  inhaler as directed.    Take the Tessalon  Perles and promethazine  DM as directed.  Do not drive, operate machinery, drink alcohol, or perform dangerous activities while taking promethazine  as it may cause drowsiness.   Follow up with your primary care provider.  Go to the emergency department if you have worsening symptoms.        ED Prescriptions     Medication Sig Dispense Auth. Provider   azithromycin  (ZITHROMAX ) 250 MG tablet Take 1 tablet (250 mg total) by mouth daily. Take first 2 tablets together, then 1 every day until finished. 6 tablet Corlis Burnard DEL, NP   benzonatate  (TESSALON ) 100 MG capsule Take 1 capsule (100 mg total) by mouth 3 (three) times daily as needed for cough. 21 capsule Corlis Burnard DEL, NP   promethazine -dextromethorphan (PROMETHAZINE -DM) 6.25-15 MG/5ML syrup Take 5 mLs by mouth 4 (four) times daily as needed. 118 mL Corlis Burnard DEL, NP      PDMP not reviewed this encounter.    [1]  Social History Tobacco Use   Smoking status: Never  Substance Use Topics   Alcohol use: No   Drug use: No     Corlis Burnard DEL, NP 11/16/24 1709  "

## 2024-11-16 NOTE — Discharge Instructions (Addendum)
 Take the Zithromax  as directed.  Continue to use your albuterol  inhaler as directed.    Take the Tessalon  Perles and promethazine  DM as directed.  Do not drive, operate machinery, drink alcohol, or perform dangerous activities while taking promethazine  as it may cause drowsiness.   Follow up with your primary care provider.  Go to the emergency department if you have worsening symptoms.

## 2024-11-27 ENCOUNTER — Ambulatory Visit: Payer: Self-pay

## 2024-11-27 ENCOUNTER — Ambulatory Visit
Admission: EM | Admit: 2024-11-27 | Discharge: 2024-11-27 | Disposition: A | Discharge status: Home / Self Care | Payer: Self-pay | Attending: Emergency Medicine | Admitting: Emergency Medicine

## 2024-11-27 DIAGNOSIS — R062 Wheezing: Secondary | ICD-10-CM

## 2024-11-27 DIAGNOSIS — R0602 Shortness of breath: Secondary | ICD-10-CM

## 2024-11-27 DIAGNOSIS — R058 Other specified cough: Secondary | ICD-10-CM

## 2024-11-27 MED ORDER — PREDNISONE 10 MG (21) PO TBPK: ORAL_TABLET | Freq: Every day | ORAL | 0 refills | Status: AC

## 2024-11-27 MED ORDER — AMOXICILLIN-POT CLAVULANATE 875-125 MG PO TABS: 1.0000 | ORAL_TABLET | Freq: Two times a day (BID) | ORAL | 0 refills | Status: AC

## 2024-11-27 MED ORDER — ALBUTEROL SULFATE HFA 108 (90 BASE) MCG/ACT IN AERS: 1.0000 | INHALATION_SPRAY | Freq: Four times a day (QID) | RESPIRATORY_TRACT | 0 refills | Status: AC | PRN

## 2024-11-27 NOTE — ED Triage Notes (Signed)
 Patient to Urgent Care with complaints of productive cough/ post nasal drip/ headaches. No fevers.   Using zyrtec/ flonase / netti-pot. Completed abx prescribed 12/31 with little change in symptoms.

## 2024-11-27 NOTE — ED Provider Notes (Signed)
 " CAY RALPH PELT    CSN: [id number] Arrival date & time: 11/27/24  1102      History   Chief Complaint Chief Complaint  Patient presents with   Cough   Wheezing   Nasal Congestion    HPI Tammy James is a 54 y.o. female.  Accompanied by her husband, patient presents with worsening congestion and productive cough over the last week.  She reports shortness of breath with coughing episodes.  No fever or chest pain.  She recently completed Zithromax .  She has been treating her symptoms with Flonase  nasal spray and Zyrtec.  She reports no history of chronic lung disease.  Patient was seen here on 11/16/2024; diagnosed with acute upper respiratory infection; treated with Zithromax  and Promethazine  DM.  The history is provided by the patient, the spouse and medical records.    History reviewed. No pertinent past medical history.  Patient Active Problem List   Diagnosis Date Noted   Chronic otitis externa 10/01/2022   Contact dermatitis 10/01/2022   Neck pain 10/01/2022   Alopecia 10/16/2021   Acute sinusitis with symptoms > 10 days 10/25/2014   Metatarsalgia 06/14/2013   Abnormal pregnancy 02/20/2012   Ectopic pregnancy 02/20/2012   Bilateral impacted cerumen 06/16/2005    History reviewed. No pertinent surgical history.  OB History   No obstetric history on file.      Home Medications    Prior to Admission medications  Medication Sig Start Date End Date Taking? Authorizing Provider  albuterol  (VENTOLIN  HFA) 108 (90 Base) MCG/ACT inhaler Inhale 1-2 puffs into the lungs every 6 (six) hours as needed. 11/27/24  Yes Corlis Burnard DEL, NP  amoxicillin -clavulanate (AUGMENTIN ) 875-125 MG tablet Take 1 tablet by mouth every 12 (twelve) hours. 11/27/24  Yes Corlis Burnard DEL, NP  predniSONE  (STERAPRED UNI-PAK 21 TAB) 10 MG (21) TBPK tablet Take by mouth daily. As directed 11/27/24  Yes Corlis Burnard DEL, NP  benzonatate  (TESSALON ) 100 MG capsule Take 1 capsule (100 mg total) by mouth 3  (three) times daily as needed for cough. 11/16/24   Corlis Burnard DEL, NP  clobetasol (TEMOVATE) 0.05 % external solution SMARTSIG:sparingly Topical Daily PRN    [provider]  ferrous sulfate 325 (65 FE) MG tablet Take by mouth.    [provider]  fluticasone  (FLONASE ) 50 MCG/ACT nasal spray Place 2 sprays into both nostrils daily. 11/13/21   Tonette Lauraine HERO, PA-C  GAVILYTE-G 236 g solution Take by mouth as directed.    [provider]  KURVELO 0.15-30 MG-MCG tablet Take 1 tablet by mouth daily.    [provider]  mometasone (ELOCON) 0.1 % cream mometasone 0.1 % topical cream  Apply a thin film to the affected skin areas by topical route 1-2 times per week for maintenance    [provider]  Multiple Vitamins-Iron (DAILY-VITE/IRON/BETA-CAROTENE) TABS Take by mouth.    [provider]  ondansetron  (ZOFRAN  ODT) 4 MG disintegrating tablet Take 1 tablet (4 mg total) by mouth every 6 (six) hours as needed for nausea or vomiting. 11/18/15   Dicky Anes, MD  promethazine -dextromethorphan (PROMETHAZINE -DM) 6.25-15 MG/5ML syrup Take 5 mLs by mouth 4 (four) times daily as needed. Patient not taking: Reported on 11/27/2024 11/16/24   Corlis Burnard DEL, NP    Family History Family History  Family history unknown: Yes    Social History Social History[1]   Allergies   Isopropyl alcohol, Minoxidil, Sulfa antibiotics, and Alcohol-sulfur [elemental sulfur]   Review of Systems Review  of Systems  Constitutional:  Negative for chills and fever.  HENT:  Positive for congestion. Negative for ear pain and sore throat.   Respiratory:  Positive for cough, shortness of breath and wheezing.   Cardiovascular:  Negative for chest pain and palpitations.  Neurological:  Positive for headaches.     Physical Exam Triage Vital Signs ED Triage Vitals [11/27/24 1306]  Encounter Vitals Group     BP (!) 137/98     Girls Systolic BP Percentile      Girls  Diastolic BP Percentile      Boys Systolic BP Percentile      Boys Diastolic BP Percentile      Pulse Rate 97     Resp 18     Temp 98.1 F (36.7 C)     Temp src      SpO2 94 %     Weight      Height      Head Circumference      Peak Flow      Pain Score 10     Pain Loc      Pain Education      Exclude from Growth Chart    No data found.  Updated Vital Signs BP 126/85 (BP Location: Left Arm)   Pulse 97   Temp 98.1 F (36.7 C)   Resp 18   SpO2 98%   Visual Acuity Right Eye Distance:   Left Eye Distance:   Bilateral Distance:    Right Eye Near:   Left Eye Near:    Bilateral Near:     Physical Exam Constitutional:      General: She is not in acute distress.    Appearance: She is obese.  HENT:     Right Ear: Tympanic membrane normal.     Left Ear: Tympanic membrane normal.     Nose: Congestion present.     Mouth/Throat:     Mouth: Mucous membranes are moist.     Pharynx: Oropharynx is clear.  Cardiovascular:     Rate and Rhythm: Normal rate and regular rhythm.     Heart sounds: Normal heart sounds.  Pulmonary:     Effort: Pulmonary effort is normal. No respiratory distress.     Breath sounds: Wheezing present.     Comments: Faint expiratory wheeze bilaterally. Neurological:     Mental Status: She is alert.      UC Treatments / Results  Labs (all labs ordered are listed, but only abnormal results are displayed) Labs Reviewed - No data to display  EKG   Radiology DG Chest 2 View Result Date: 11/27/2024 CLINICAL DATA:  Cough, wheezing and shortness of breath. EXAM: CHEST - 2 VIEW COMPARISON:  None Available. FINDINGS: Trachea is midline. Heart size normal. Lungs are clear. No pleural fluid. IMPRESSION: Negative. Electronically Signed   By: Newell Eke M.D.   On: 11/27/2024 14:40    Procedures Procedures (including critical care time)  Medications Ordered in UC Medications - No data to display  Initial Impression / Assessment and Plan / UC  Course  I have reviewed the triage vital signs and the nursing notes.  Pertinent labs & imaging results that were available during my care of the patient were reviewed by me and considered in my medical decision making (see chart for details).    Productive cough, wheezing, shortness of breath. CXR negative.  O2 sat 98% on room air. Treating today with albuterol  inhaler, prednisone , Augmentin .  Education provided on  cough and shortness of breath.  Instructed patient to follow-up with her PCP tomorrow.  ED precautions given.  She agrees to plan of care.  Final Clinical Impressions(s) / UC Diagnoses   Final diagnoses:  Productive cough  Wheezing  Shortness of breath     Discharge Instructions      Follow up with your primary care provider tomorrow.  Go to the emergency department if you have worsening symptoms.    Use the albuterol  inhaler as directed.  Take the prednisone  and Augmentin  as directed.         ED Prescriptions     Medication Sig Dispense Auth. Provider   albuterol  (VENTOLIN  HFA) 108 (90 Base) MCG/ACT inhaler Inhale 1-2 puffs into the lungs every 6 (six) hours as needed. 18 g Corlis Burnard DEL, NP   predniSONE  (STERAPRED UNI-PAK 21 TAB) 10 MG (21) TBPK tablet Take by mouth daily. As directed 21 tablet Corlis Burnard DEL, NP   amoxicillin -clavulanate (AUGMENTIN ) 875-125 MG tablet Take 1 tablet by mouth every 12 (twelve) hours. 14 tablet Corlis Burnard DEL, NP      PDMP not reviewed this encounter.     [1]  Social History Tobacco Use   Smoking status: Never  Substance Use Topics   Alcohol use: No   Drug use: No     Corlis Burnard DEL, NP 11/27/24 1514  "

## 2024-11-27 NOTE — Discharge Instructions (Addendum)
 Follow up with your primary care provider tomorrow.  Go to the emergency department if you have worsening symptoms.    Use the albuterol  inhaler as directed.  Take the prednisone  and Augmentin  as directed.
